# Patient Record
Sex: Female | Born: 1965
Health system: Southern US, Community
[De-identification: ages and names within clinical notes are randomized; demographics above are authoritative.]

## PROBLEM LIST (undated history)

## (undated) DIAGNOSIS — D219 Benign neoplasm of connective and other soft tissue, unspecified: Secondary | ICD-10-CM

## (undated) DIAGNOSIS — D649 Anemia, unspecified: Secondary | ICD-10-CM

## (undated) DIAGNOSIS — R87619 Unspecified abnormal cytological findings in specimens from cervix uteri: Secondary | ICD-10-CM

## (undated) DIAGNOSIS — A64 Unspecified sexually transmitted disease: Secondary | ICD-10-CM

## (undated) DIAGNOSIS — IMO0002 Reserved for concepts with insufficient information to code with codable children: Secondary | ICD-10-CM

## (undated) DIAGNOSIS — L309 Dermatitis, unspecified: Secondary | ICD-10-CM

## (undated) DIAGNOSIS — Z87898 Personal history of other specified conditions: Secondary | ICD-10-CM

## (undated) DIAGNOSIS — M1990 Unspecified osteoarthritis, unspecified site: Secondary | ICD-10-CM

## (undated) DIAGNOSIS — M419 Scoliosis, unspecified: Secondary | ICD-10-CM

## (undated) HISTORY — DX: Personal history of other specified conditions: Z87.898

## (undated) HISTORY — DX: Benign neoplasm of connective and other soft tissue, unspecified: D21.9

## (undated) HISTORY — PX: WISDOM TOOTH EXTRACTION: SHX21

## (undated) HISTORY — DX: Anemia, unspecified: D64.9

## (undated) HISTORY — DX: Scoliosis, unspecified: M41.9

## (undated) HISTORY — DX: Unspecified sexually transmitted disease: A64

## (undated) HISTORY — DX: Unspecified abnormal cytological findings in specimens from cervix uteri: R87.619

## (undated) HISTORY — DX: Reserved for concepts with insufficient information to code with codable children: IMO0002

---

## 1998-09-22 HISTORY — PX: KNEE SURGERY: SHX244

## 1999-03-08 ENCOUNTER — Other Ambulatory Visit: Admission: RE | Admit: 1999-03-08 | Discharge: 1999-03-08 | Payer: Self-pay | Admitting: Obstetrics and Gynecology

## 1999-06-25 ENCOUNTER — Ambulatory Visit (HOSPITAL_COMMUNITY): Admission: RE | Admit: 1999-06-25 | Discharge: 1999-06-25 | Payer: Self-pay | Admitting: Orthopedic Surgery

## 1999-06-25 ENCOUNTER — Encounter: Payer: Self-pay | Admitting: Orthopedic Surgery

## 1999-07-10 ENCOUNTER — Ambulatory Visit (HOSPITAL_BASED_OUTPATIENT_CLINIC_OR_DEPARTMENT_OTHER): Admission: RE | Admit: 1999-07-10 | Discharge: 1999-07-10 | Payer: Self-pay | Admitting: Orthopedic Surgery

## 1999-10-25 ENCOUNTER — Encounter: Payer: Self-pay | Admitting: Endocrinology

## 1999-10-25 ENCOUNTER — Ambulatory Visit (HOSPITAL_COMMUNITY): Admission: RE | Admit: 1999-10-25 | Discharge: 1999-10-25 | Payer: Self-pay | Admitting: Endocrinology

## 2000-01-28 ENCOUNTER — Other Ambulatory Visit: Admission: RE | Admit: 2000-01-28 | Discharge: 2000-01-28 | Payer: Self-pay | Admitting: Obstetrics and Gynecology

## 2000-05-07 ENCOUNTER — Other Ambulatory Visit: Admission: RE | Admit: 2000-05-07 | Discharge: 2000-05-07 | Payer: Self-pay | Admitting: Obstetrics & Gynecology

## 2000-06-04 ENCOUNTER — Encounter: Payer: Self-pay | Admitting: Obstetrics and Gynecology

## 2000-06-04 ENCOUNTER — Ambulatory Visit (HOSPITAL_COMMUNITY): Admission: RE | Admit: 2000-06-04 | Discharge: 2000-06-04 | Payer: Self-pay | Admitting: Obstetrics and Gynecology

## 2000-06-17 ENCOUNTER — Inpatient Hospital Stay (HOSPITAL_COMMUNITY): Admission: AD | Admit: 2000-06-17 | Discharge: 2000-06-17 | Payer: Self-pay | Admitting: Obstetrics & Gynecology

## 2000-08-11 ENCOUNTER — Inpatient Hospital Stay (HOSPITAL_COMMUNITY): Admission: AD | Admit: 2000-08-11 | Discharge: 2000-08-13 | Payer: Self-pay | Admitting: Obstetrics & Gynecology

## 2001-05-07 ENCOUNTER — Other Ambulatory Visit: Admission: RE | Admit: 2001-05-07 | Discharge: 2001-05-07 | Payer: Self-pay | Admitting: Obstetrics and Gynecology

## 2002-05-18 ENCOUNTER — Other Ambulatory Visit: Admission: RE | Admit: 2002-05-18 | Discharge: 2002-05-18 | Payer: Self-pay | Admitting: Obstetrics and Gynecology

## 2003-04-27 ENCOUNTER — Other Ambulatory Visit: Admission: RE | Admit: 2003-04-27 | Discharge: 2003-04-27 | Payer: Self-pay | Admitting: Obstetrics and Gynecology

## 2003-09-23 DIAGNOSIS — Z87898 Personal history of other specified conditions: Secondary | ICD-10-CM

## 2003-09-23 HISTORY — DX: Personal history of other specified conditions: Z87.898

## 2004-05-06 ENCOUNTER — Other Ambulatory Visit: Admission: RE | Admit: 2004-05-06 | Discharge: 2004-05-06 | Payer: Self-pay | Admitting: Obstetrics and Gynecology

## 2004-06-26 ENCOUNTER — Ambulatory Visit (HOSPITAL_COMMUNITY): Admission: RE | Admit: 2004-06-26 | Discharge: 2004-06-26 | Payer: Self-pay | Admitting: Obstetrics and Gynecology

## 2004-07-31 ENCOUNTER — Ambulatory Visit (HOSPITAL_COMMUNITY): Admission: RE | Admit: 2004-07-31 | Discharge: 2004-07-31 | Payer: Self-pay | Admitting: Obstetrics and Gynecology

## 2004-11-07 ENCOUNTER — Encounter: Admission: RE | Admit: 2004-11-07 | Discharge: 2004-11-07 | Payer: Self-pay | Admitting: Obstetrics and Gynecology

## 2004-11-07 ENCOUNTER — Encounter: Payer: Self-pay | Admitting: Obstetrics and Gynecology

## 2004-11-13 ENCOUNTER — Inpatient Hospital Stay (HOSPITAL_COMMUNITY): Admission: RE | Admit: 2004-11-13 | Discharge: 2004-11-16 | Payer: Self-pay | Admitting: Obstetrics and Gynecology

## 2005-05-20 ENCOUNTER — Other Ambulatory Visit: Admission: RE | Admit: 2005-05-20 | Discharge: 2005-05-20 | Payer: Self-pay | Admitting: Obstetrics and Gynecology

## 2005-06-26 ENCOUNTER — Ambulatory Visit (HOSPITAL_COMMUNITY): Admission: RE | Admit: 2005-06-26 | Discharge: 2005-06-26 | Payer: Self-pay | Admitting: Obstetrics and Gynecology

## 2006-07-16 ENCOUNTER — Other Ambulatory Visit: Admission: RE | Admit: 2006-07-16 | Discharge: 2006-07-16 | Payer: Self-pay | Admitting: Obstetrics and Gynecology

## 2007-01-18 ENCOUNTER — Encounter: Admission: RE | Admit: 2007-01-18 | Discharge: 2007-01-18 | Payer: Self-pay | Admitting: Obstetrics and Gynecology

## 2007-04-19 ENCOUNTER — Encounter: Admission: RE | Admit: 2007-04-19 | Discharge: 2007-04-19 | Payer: Self-pay | Admitting: Obstetrics and Gynecology

## 2007-04-23 ENCOUNTER — Encounter: Admission: RE | Admit: 2007-04-23 | Discharge: 2007-04-23 | Payer: Self-pay | Admitting: *Deleted

## 2007-05-03 ENCOUNTER — Encounter: Admission: RE | Admit: 2007-05-03 | Discharge: 2007-05-03 | Payer: Self-pay | Admitting: Obstetrics and Gynecology

## 2007-05-25 ENCOUNTER — Encounter: Admission: RE | Admit: 2007-05-25 | Discharge: 2007-05-25 | Payer: Self-pay | Admitting: Obstetrics and Gynecology

## 2007-06-08 ENCOUNTER — Ambulatory Visit (HOSPITAL_BASED_OUTPATIENT_CLINIC_OR_DEPARTMENT_OTHER): Admission: RE | Admit: 2007-06-08 | Discharge: 2007-06-08 | Payer: Self-pay | Admitting: Surgery

## 2007-08-27 ENCOUNTER — Encounter: Admission: RE | Admit: 2007-08-27 | Discharge: 2007-08-27 | Payer: Self-pay | Admitting: Surgery

## 2008-01-19 ENCOUNTER — Encounter: Admission: RE | Admit: 2008-01-19 | Discharge: 2008-01-19 | Payer: Self-pay | Admitting: Surgery

## 2008-01-21 ENCOUNTER — Encounter: Admission: RE | Admit: 2008-01-21 | Discharge: 2008-01-21 | Payer: Self-pay | Admitting: Surgery

## 2009-01-22 ENCOUNTER — Emergency Department (HOSPITAL_COMMUNITY): Admission: EM | Admit: 2009-01-22 | Discharge: 2009-01-22 | Payer: Self-pay | Admitting: Family Medicine

## 2009-05-15 ENCOUNTER — Encounter: Admission: RE | Admit: 2009-05-15 | Discharge: 2009-05-15 | Payer: Self-pay | Admitting: Surgery

## 2010-07-08 ENCOUNTER — Encounter: Admission: RE | Admit: 2010-07-08 | Discharge: 2010-07-08 | Payer: Self-pay | Admitting: Obstetrics and Gynecology

## 2011-02-04 NOTE — Op Note (Signed)
NAMEDEVITA, NIES               ACCOUNT NO.:  [id number]   MEDICAL RECORD NO.:  [id number]          PATIENT TYPE:  AMB   LOCATION:  [facility]                         FACILITY:  [facility]   PHYSICIAN:  Thomas A. Cornett, M.D.DATE OF BIRTH:  1966-07-19   DATE OF PROCEDURE:  06/08/2007  DATE OF DISCHARGE:                               OPERATIVE REPORT   PREOPERATIVE DIAGNOSIS:  Right breast mastitis.   POSTOPERATIVE DIAGNOSES:  1. Right breast mastitis.  2. Multiple complex right breast abscesses.   PROCEDURE:  Debridement of right breast with incision and drainage of  complex right breast multiple abscesses.   SURGEON:  Harriette Bouillon, M.D.   ANESTHESIA:  LMA with 20 mL of 0.25% Sensorcaine.   ESTIMATED BLOOD LOSS:  60 mL.   Cultures were taken.   DRAIN:  1/4 inch Penrose drain tied to itself through two separate stab  incisions.   INDICATIONS FOR PROCEDURE:  The patient is a 45 year old female whose  has seen my office for right breast swelling and abscesses.  We drained  two of these and it is still a recurrent problem.  She is brought to the  operative room today for exam under anesthesia, debridement of any  devitalized tissue and drainage of any undrained collections.   DESCRIPTION OF PROCEDURE:  The patient brought to the operating room,  placed supine.  Induction of LMA anesthesia, right breast prepped,  draped in sterile fashion.  She had to previous incisions, one at 11  o'clock at the nipple skin junction and a second at about 9 o'clock in  the right mid breast.  I was able after sterile prep and drape to find a  pus collection with palpation.  I placed a hemostat into it went down  deep into the breast at about 11 to 12 clock where the incision already  was.  It actually partially drained.  I took my second finger, went into  the second incision laterally and connected the two.  There is a chronic  abscess cavity, also another small abscess under the nipple and I was  able to my finger break up these loculations.  I then irrigated out the  wound with saline until clear.  Hemostasis was achieved.  I used a 1/4  inch Penrose to connect both wounds and tied the Penrose to itself for  more drainage than we had before.  There is a chronic and inflammatory  cavity as well and I had to debride some material  out of that.  I then packed both incisions with saline-soaked Kerlix and  placed dry dressings over this.  All final counts of sponge, needle and  instruments found be correct this portion of the case.  The patient was  then taken to recovery after awakening in satisfactory condition.      Thomas A. Cornett, M.D.  Electronically Signed     TAC/MEDQ  D:  06/08/2007  T:  06/09/2007  Job:  [id number]

## 2011-02-07 NOTE — H&P (Signed)
[facility]  Patient:    Wendy Huffman, Wendy Huffman                       MRN: [id number] Adm. Date:  [id number] Attending:  Cleatrice Burke Dictator:   Miguel Dibble, C.N.M.                         History and Physical  DATE OF BIRTH:                12-29-1965                                This is a 45 year old gravida 1, para 0 at 100 6/7 weeks who has been having prolonged prodromal labor over the last 24 hours.  After therapeutic rest with morphine for four hours she had progressed from 3 cm to 4 cm, completely effaced, vertex at -3, intact bag of waters, contracting stronger every three to four minutes.  She has a history of positive group beta strep.  PRENATAL LABORATORIES:        Hemoglobin 10.2, hematocrit 32.9, platelets 267. Blood type and Rh O+.  Rh antibodies negative.  Sickle cell trait negative. VDRL nonreactive.  Rubella titer immune.  Hepatitis B surface antigen negative.  HIV negative.  Pap smear within normal limits.  Gonorrhea and chlamydia cultures negative.  Maternal serum alpha-fetoprotein within normal limits.  Glucose challenge test within normal limits.  Group beta strep positive.                                Ultrasound reveals normal anatomy, confirms EDC.  ALLERGIES:                    No known drug allergies.  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  She discontinued Parlodel prior to conception.  FAMILY HISTORY:                Maternal grandmother with heart surgery, mother with lung disease and asthma, maternal grandmother with insulin-dependent diabetes.  GENETIC HISTORY:              Negative.  SOCIAL HISTORY:               African-American.  Single.  Father of the baby is Sharlyne Cai.  Currently, her friend is present  as support.  College graduate.  Works in Medical sales representative.  Father of the baby is a high Garment/textile technologist, works as an Training and development officer.  Denies smoking, alcohol, or drug abuse.  PHYSICAL EXAMINATION:  HEENT:                        Within normal limits.  LUNGS:                        Bilaterally clear.  HEART:                        Regular rate and rhythm.  ABDOMEN:  Soft, nontender.  Contractions every three to four minutes.  Fetal heart rate:  Mild variables.  PELVIC:                       Cervix is 4 cm, completely effaced, vertex at -3.  Intact bag of waters.  EXTREMITIES:                  Trace edema.  DTRs +1.  ASSESSMENT:                   Primigravida at term in early active phase labor, OP position.  Positive group beta strep.  PLAN:                         Admit to labor and delivery.  Notify  Dr. Elliot Gault of admission and status.  Routine [facility] orders including group beta strep prophylaxis with penicillin.  Anticipate amniotomy to augment labor, positioning to assist descent.  Anticipate normal spontaneous vaginal delivery. DD:  08/11/00 TD:  08/11/00 Job: [id number]

## 2011-02-07 NOTE — Op Note (Signed)
[facility]  Patient:    Wendy Huffman, Wendy Huffman                       MRN: [id number] Proc. Date: 08/11/00 Attending:  Dierdre Forth [place]                           Operative Report  PREOPERATIVE DIAGNOSES:         1. Term intrauterine pregnancy.                                 2. Active phase arrest.                                 3. Suspect occipitoposterior.  POSTOPERATIVE DIAGNOSES:        1. Term intrauterine pregnancy.                                 2. Active phase arrest.                                 3. Occipitoposterior, persistent.                                 4. Fundal posterior fibroid.  OPERATION:                      Low cervical transverse cesarean section, primary.  SURGEON:                        Georgina Peer, M.D.  ASSISTANT:                      Mack Guise, C.N.M.  ANESTHESIA:                     Epidural.  ESTIMATED BLOOD LOSS:           1500 cc.  COMPLICATIONS:                  None.  FINDINGS:                       Seven pound 12 ounce female named Donovan at 2034, occipitoposterior, Apgars 9 and 9, cord pH 7.11 arterial, 7 cm fundal fibroid noted on the posterior fundal aspect of the uterus.  Normal tubes and ovaries.  INDICATIONS:                    This is a 45 year old gravida 1, para 0, who presented at 39-3/7 weeks with ruptured membranes and regular contractions. She progressed spontaneously to 8 cm, however, there was no descent of the head past -2 station.  Significant and deep variable components without late component were noted.  Amnioinfusion failed to completely resolve these, however, they did not worsen.  With the patients cervix edematous and persistently _____ despite adequate contractions documented by IUPC, a decision for cesarean section was made.  Risks and benefits including bleeding, infection, damage to abdominal structures, blood clots were also informed  and  accepted.  DESCRIPTION OF PROCEDURE:       The patient was taken to the operating room. She had an epidural placed during labor and also a Foley catheter.  She was prepped and draped in the normal sterile fashion.  After adequate anesthesia was documented, transverse skin incision was made, taken through the fascia and the peritoneal cavity in the normal Pfannenstiel manner.  A transverse bladder flap was created and a transverse uterine incision was made.  At 2034 a female infant was noted to be in direct occipitoposterior presentation.  The head was slightly rotated anteriorly and delivered with the aid of fundal pressure.  The infant cried spontaneously. Mouth and nose were suctioned.  Infant was handed to pediatric team in attendance after the cord was doubly clamped and cut.  Cord blood was taken and the arterial cord pH was obtained which was 7.11.  Apgars were assigned at 9 and 9.  Placenta and membranes were removed.  There was an extension of the left side of the uterine incision from the manual head extension.  This was repaired in two layers of 0 Vicryl.  The incision was then closed in two layers of Vicryl with excellent hemostasis.  Tubes and ovaries were inspected and found to be normal.  There was a 7 cm fundal fibroid off the fundal posterior left portion of the uterus.  The peritoneal cavity was irrigated and all debris was removed.  The peritoneal surfaces appeared normal.  The fascia was closed with 0 Vicryl suture after the rectus muscles were closed over the bladder.  The subcutaneous tissue was closed with a plain stitch and the skin was closed with skin staples.  The patient received 2 g of Cefotan after the cord clamped.  Sponge, needle and instrument counts were correct. DD:  08/11/00 TD:  08/12/00 Job: [id number]

## 2011-02-07 NOTE — H&P (Signed)
Wendy Huffman, Wendy Huffman NO.:  [id number]   MEDICAL RECORD NO.:  [id number]          PATIENT TYPE:  INP   LOCATION:  NA                            FACILITY:  [facility]   PHYSICIAN:  Hal Morales, M.D.DATE OF BIRTH:  May 30, 1966   DATE OF ADMISSION:  DATE OF DISCHARGE:                                HISTORY & PHYSICAL   DATE OF ADMISSION:  November 13, 2004   HISTORY OF PRESENT ILLNESS:  Wendy Huffman is a gravida 2 para 1-0-0-1 at 65-  and-a-half weeks gestation who presents for repeat C-section. Her pregnancy  has been essentially uncomplicated. She has been size greater than dates  throughout secondary to obesity. Ultrasound evaluation of the fetus  throughout the pregnancy has shown normal growth and normal fluid. The  patient has been normotensive with no proteinuria.   Her pregnancy has been followed by the C.N.M./M.D. service at [facility] and is  remarkable for:  1.  Advanced maternal age.  2.  A history of fibroids.  3.  History of infertility.  4.  Previous C-section.  5.  History of increased blood pressure.  6.  Obesity.   The patient initially evaluated at [facility] on May 06, 2004 at [redacted] weeks  gestation. The patient declined amniocentesis. Her ultrasound at [facility] was within normal limits.   PRENATAL LABORATORY WORK:  On May 28, 2004:  Hemoglobin and hematocrit  10.3 and 33.7; platelets 269,000. Blood type and Rh O positive, antibody  screen negative. Sickle cell trait negative. VDRL nonreactive. Rubella  immune. Hepatitis B surface antigen negative. HIV nonreactive. Pap smear  within normal limits. GC and chlamydia negative. CF testing declined. Quad  screen declined. At 18 weeks, 1-hour glucose challenge within normal limits.  At 28 weeks, 1-hour glucose challenge also within normal limits, and RPR  nonreactive.   OBSTETRICAL HISTORY:  In 2001 the patient had a primary low transverse  cesarean section with the birth of a 7-pound 13-ounce  female infant with Dr.  Elliot Gault. Failure to progress, PIH, and fetal distress complicated that  delivery, and the current pregnancy.   MEDICAL HISTORY:  Significant for fibroid tumors. She does have a history of  infertility. She used Clomid to conceive this pregnancy. 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 Juleen China is following this  pituitary tumor. The patient was diagnosed with scoliosis as a child. She  has a history of C-section and left knee surgery.   FAMILY HISTORY:  Maternal grandmother with a history of heart disease. The  patient's mother with sarcoidosis and she died at age 70. A maternal aunt  and the patient's maternal grandmother with history of diabetes. The patient  has great uncles with prostate and bone cancer. Maternal grandfather with a  history of CVA.   GENETIC HISTORY:  Significant for two cousins of the third and fourth  generation on her maternal side with a history of cleft lip and palate and a  second cousin on her maternal side with a history of muscular dystrophy.   The patient has no  known drug allergies and she does deny the use of  tobacco, alcohol, and illicit drugs. Her height is 5 feet 5 inches. Her  pregravid weight is 276 and her weight at her last prenatal visit was 304  pounds.   SOCIAL HISTORY:  Wendy Huffman is a 45 year old married African-American  female. She works as a Warden/ranger. Her husband is a Company secretary. His  name is Mikaella Escalona. He is involved and supportive. They are [place] in  their faith.   REVIEW OF SYSTEMS:  Is as described above. The patient is typical of one  with a uterine pregnancy at term. She presents for repeat low transverse  cesarean section at 38-and-a-half weeks.   PLAN:  Admit per Dr. Dierdre Forth. Preoperative and routine surgical  orders per Dr. Pennie Rushing.      SDM/MEDQ  D:  11/12/2004  T:  11/12/2004  Job:  [id number]

## 2011-02-07 NOTE — Discharge Summary (Signed)
NAMEEMILIA, Wendy Huffman               ACCOUNT NO.:  [id number]   MEDICAL RECORD NO.:  [id number]          PATIENT TYPE:  INP   LOCATION:  9147                          [facility]ACILITY:  [facility]   PHYSICIAN:  Naima A. Dillard, M.D. DATE OF BIRTH:  1966-05-27   DATE OF ADMISSION:  11/13/2004  DATE OF DISCHARGE:  11/16/2004                                 DISCHARGE SUMMARY   ADMISSION DIAGNOSES:  1.  Intrauterine pregnancy at term.  2.  Previous cesarean section with desire for repeat.   DISCHARGE DIAGNOSES:  1.  Intrauterine pregnancy at term.  2.  Prior cesarean section with desire for repeat.  3.  Probable mild chronic hypertension.   PROCEDURE:  Repeat low transverse cesarean section.   HOSPITAL COURSE:  Wendy Huffman is a 45 year old gravida 2, para 1-0-0-1, at 1-  1/2 weeks, who presented on November 13, 2004, for scheduled repeat cesarean  section.  Her pregnancy had been remarkable for:  1) Advanced maternal age.  2) History of fibroids.  3) History of infertility.  4) Previous cesarean  section with desire for repeat.  5) History of increased blood pressure with  her last labor and over the last two visits.  6) Obesity.  The patient was  taken to the operating room where a repeat low transverse cesarean section  was performed.  The patient was delivered of a viable female by the name of  Wendy Huffman, 6 pounds 10 ounces with Apgars of 9 and 9.  The uterus contained a 4 cm  posterior fundal myoma.  Estimated blood loss was approximately 750 mL.  This was done under spinal anesthesia by Hal Morales, M.D.  The  patient tolerated the procedure well.  The skin was closed with staples and  a Al Pimple  drain was in place.  The infant was taken to the fullterm  nursery in good condition.  By postoperative day #1, the patient was doing  well.  She was up ad lib, she was breastfeeding.  Her hemoglobin was 10.0  down from 10.3.  White blood cell count was 13.8, platelet count was 215.  Her  incision was clean, dry, and intact.  She was given some supplemental  iron.  Through the rest of her hospital course, she was doing well.  Her JP  drain was noted to have scant serosanguineous drainage.  By postoperative  day #3, she was up ad lib, tolerating a regular diet.  She denied headache,  visual symptoms, or epigastric pain.  She was undecided regarding birth  control.  Her blood pressure on the day of discharge was 158/98, 137/89, and  146/92.  Other vital signs were stable.  Her incision was clean, dry, and  intact with staples noted.  The JP drain had minimal to no drainage through  the night.  Her extremities were within normal limits.  Dr. Normand Sloop was  consulted and the decision was made to maintain the staples for a total of  seven days postoperative.  The JP drain was removed without difficulty and a  plan was also made to recheck the patient's  blood pressure with her staple  removal on November 20, 2004.  The patient was deemed to have received the full  benefit of her hospital stay and was discharged home on November 16, 2004.   DISCHARGE INSTRUCTIONS:  Per [facility] handout.  PIH precautions  were also reviewed with the patient.   DISCHARGE MEDICATIONS:  1.  Motrin 600 mg p.o. q.6h p.r.n. pain.  2.  Tylox one to two p.o. q.3-4h p.r.n. pain.  3.  Prenatal vitamins one p.o. daily.   FOLLOW UP:  Discharge follow-up will occur on November 20, 2004, for staple  removal and blood pressure recheck and then at six weeks postoperatively.      VLL/MEDQ  D:  11/16/2004  T:  11/17/2004  Job:  [id number]

## 2011-02-07 NOTE — Discharge Summary (Signed)
[facility]  Patient:    MINAL, STULLER                       MRN: [id number] Adm. Date:  [id number] Disc. Date: [id number] Attending:  Cleatrice Burke Dictator:   Vance Gather Duplantis, C.N.M.                           Discharge Summary  ADMISSION DIAGNOSES:          1. Intrauterine pregnancy at term.                               2. Active phase of labor.                               3. Occipitoposterior.                               4. Positive group B Streptococcus.  DISCHARGE DIAGNOSES:          1. Intrauterine pregnancy at term.                               2. Active phase of labor.                               3. Occipitoposterior.                               4. Positive group B Streptococcus.                               5. Arrest of active phase of labor.                               5. Status post cesarean section.                               6. Fibroids                               7. Breast feeding.                               8. Desires oral contraceptives for birth                                  control.  PROCEDURES:                   Primary low transverse cesarean section for delivery of a viable female infant named Domenic Schwab who weighed 7 pounds 12 ounces with Apgars of 9/9 attended by Dr. Trudie Reed for delivery.  HOSPITAL COURSE:              Ms. Yetta Barre is a 45 year old gravida 1,  para 0, at 39-3/7 weeks who was presented with contractions every 2 to 3 minutes for the last 2 to 4 hours and was found to be 2 to 3 cm on admission and was admitted in early labor.  She progressed to about 8 cm but failed to progress further despite adequate contractions.  It was recommended to proceed with cesarean section for delivery.  She agreed and underwent the same for delivery of a viable female infant who weighted 7 pounds 12 ounces, Apgars 9/9, and whose name is Psychologist, prison and probation services.  He was found to be in the OP presentation at delivery.   His delivery was effected by Dr. Trudie Reed and Mack Guise, C.N.M.  Postoperatively, she has done well.  She is ambulating, voiding, and eating without difficulty.  She is breast feeding also without difficulty.  Her vital signs are stable, and she is afebrile.  She desires discharge today.  DISCHARGE INSTRUCTIONS:       As per the [facility] handout.  DISCHARGE MEDICATIONS:        1. Motrin 600 mg p.o. q.6h. p.r.n. pain.                               2. Tylox 1 to 2 p.o. q.4-6h. p.r.n. pain.                               3. Amoxicillin 500 mg t.i.d. for 14 days.                               4. Micronor 1 p.o. q.d. starting in 2 weeks.  DISCHARGE LABORATORY DATA:    Hemoglobin 9.5, WBC count 19.0.  DISCHARGE FOLLOWUP:           [facility] in six weeks or p.r.n. DD:  09/26/00 TD:  09/27/00 Job: [id number]

## 2011-02-07 NOTE — Op Note (Signed)
Wendy Huffman, BESSENT NO.:  [id number]   MEDICAL RECORD NO.:  [id number]          PATIENT TYPE:  INP   LOCATION:  9147                          FACILITY[facility]   PHYSICIAN:  Hal Morales, M.D.DATE OF BIRTH:  1966/04/25   DATE OF PROCEDURE:  11/13/2004  DATE OF DISCHARGE:                                 OPERATIVE REPORT   PREOPERATIVE DIAGNOSES:  1.  Intrauterine pregnancy at term.  2.  Prior cesarean section, desire for repeat cesarean section.   POSTOPERATIVE DIAGNOSES:  1.  Intrauterine pregnancy at term.  2.  Prior cesarean section, desire for repeat cesarean section.   OPERATION:  Repeat low transverse cesarean section.   SURGEON:  Hal Morales, M.D.   FIRST ASSISTANT:  Mack Guise, certified nurse midwife.   ANESTHESIA:  Spinal.   ESTIMATED BLOOD LOSS:  750 mL.   COMPLICATIONS:  None.   FINDINGS:  The patient was delivered of a female infant whose name is [name],  weighing 6 pounds 10 ounces, with Apgars of 9 and 9 at one and five minutes,  respectively.  The tubes and ovaries were normal for the gravid state.  The  uterus contained a 4 cm posterior fundal myoma.   PROCEDURE:  The patient taken to the operating room after appropriate  identification and placed on the operating table.  After the attainment of  adequate spinal anesthesia, she was placed in the supine position with a  left lateral tilt.  The abdomen and perineum were prepped with multiple  layers of Betadine and a Foley catheter inserted into the bladder and  connected to straight drainage.  The abdomen was draped as a sterile field.  The suprapubic region at the site of previous cesarean section incision was  infiltrated with 20 mL of 0.25% Marcaine after assuring adequate anesthesia.  A transverse incision was made in this area and the abdomen opened in  layers.  The peritoneum was entered and the bladder blade placed.  The uterus was incised approximately 2 cm above  the uterovesical fold and  that incision taken laterally on either side bluntly.  The infant was  delivered from the occiput transverse position and after having the nares  and pharynx DeLee suctioned, was handed off to the awaiting pediatricians.  The appropriate cord blood was drawn and the placenta noted to have  separated from the uterus and was removed from the operative field.  Uterine  incision was closed with a running interlocking suture of 0 Vicryl.  An  imbricating suture of 0 Vicryl was placed.  Hemostasis was achieved with a  serosal stitch of 2-0 Vicryl and a figure-of-eight stitch in the left  incisional apex.  Copious irrigation was carried out and hemostasis noted to  be adequate.  The abdominal peritoneum was closed with running suture of 2-0  Vicryl.  The rectus muscles were reapproximated in midline with figure-of-  eight suture of 2-0 Vicryl.  The rectus fascia was closed with a running  suture of 0 Vicryl, then reinforced on either side of midline with figure-of-  eight sutures of 0  Vicryl.  The subcutaneous tissue was irrigated and made  hemostatic with Bovie cautery.  A Jackson-Pratt drain was placed in  subcutaneous tissue and exited through a left lower quadrant stab wound.  The incision was closed with skin staples.  A sterile dressing was applied.  The patient was taken from the operating room to the recovery room in  satisfactory condition having tolerated the procedure well, with sponge and  instrument counts correct.  The infant went to the full-term nursery.      VPH/MEDQ  D:  11/13/2004  T:  11/13/2004  Job:  [id number]

## 2011-07-03 LAB — BASIC METABOLIC PANEL
BUN: 7
Calcium: 9
Creatinine, Ser: 0.68
GFR calc non Af Amer: >60
Glucose, Bld: 86
Potassium: 3.3 — ABNORMAL LOW

## 2011-07-03 LAB — CBC
HCT: 29 — ABNORMAL LOW
Platelets: 447 — ABNORMAL HIGH
RDW: 16 — ABNORMAL HIGH
WBC: 14.2 — ABNORMAL HIGH

## 2011-07-03 LAB — ANAEROBIC CULTURE

## 2011-07-03 LAB — DIFFERENTIAL
Basophils Absolute: 0
Basophils Relative: 0
Eosinophils Absolute: 0.3
Lymphocytes Relative: 23
Monocytes Absolute: 1 — ABNORMAL HIGH
Neutrophils Relative %: 68

## 2011-07-03 LAB — WOUND CULTURE: Culture: NO GROWTH

## 2011-07-03 LAB — POCT PREGNANCY, URINE: Operator id: 268271

## 2011-07-07 ENCOUNTER — Other Ambulatory Visit: Payer: Self-pay | Admitting: Obstetrics and Gynecology

## 2011-07-07 DIAGNOSIS — Z1239 Encounter for other screening for malignant neoplasm of breast: Secondary | ICD-10-CM

## 2011-07-21 ENCOUNTER — Ambulatory Visit
Admission: RE | Admit: 2011-07-21 | Discharge: 2011-07-21 | Disposition: A | Discharge status: Home / Self Care | Payer: 59 | Source: Ambulatory Visit | Attending: Obstetrics and Gynecology | Admitting: Obstetrics and Gynecology

## 2011-07-21 DIAGNOSIS — Z1239 Encounter for other screening for malignant neoplasm of breast: Secondary | ICD-10-CM

## 2012-01-07 ENCOUNTER — Telehealth: Payer: Self-pay | Admitting: Obstetrics and Gynecology

## 2012-01-19 ENCOUNTER — Other Ambulatory Visit: Payer: Self-pay | Admitting: Obstetrics and Gynecology

## 2012-01-20 NOTE — Progress Notes (Signed)
Addended by: Bani Gianfrancesco P on: 01/20/2012 10:48 AM   Modules accepted: Orders  

## 2012-01-22 ENCOUNTER — Encounter: Payer: Self-pay | Admitting: Obstetrics and Gynecology

## 2012-02-02 ENCOUNTER — Encounter (HOSPITAL_COMMUNITY): Payer: Self-pay | Admitting: Pharmacist

## 2012-02-09 ENCOUNTER — Encounter: Payer: Self-pay | Admitting: Obstetrics and Gynecology

## 2012-02-09 ENCOUNTER — Telehealth: Payer: Self-pay | Admitting: Obstetrics and Gynecology

## 2012-02-09 NOTE — Telephone Encounter (Signed)
LTC SCHEDULED FOR 02/20/12 @ 11:15 WITH [facility].  [employer] EFFECTIVE  09/23/11.  PLAN PAYS 80/20 AFTER A $1,000 DEDUCTIBLE.  PRE-OP DUE $109.48.

## 2012-02-11 ENCOUNTER — Encounter: Payer: Self-pay | Admitting: Obstetrics and Gynecology

## 2012-02-11 ENCOUNTER — Encounter (HOSPITAL_COMMUNITY): Payer: Self-pay

## 2012-02-11 ENCOUNTER — Encounter (HOSPITAL_COMMUNITY)
Admission: RE | Admit: 2012-02-11 | Discharge: 2012-02-11 | Disposition: A | Discharge status: Home / Self Care | Payer: 59 | Source: Ambulatory Visit | Attending: Obstetrics and Gynecology | Admitting: Obstetrics and Gynecology

## 2012-02-11 HISTORY — DX: Dermatitis, unspecified: L30.9

## 2012-02-11 HISTORY — DX: Unspecified osteoarthritis, unspecified site: M19.90

## 2012-02-11 LAB — CBC
Platelets: 280 10*3/uL (ref 150–400)
RBC: 4.98 MIL/uL (ref 3.87–5.11)
WBC: 11.1 10*3/uL — ABNORMAL HIGH (ref 4.0–10.5)

## 2012-02-11 LAB — SURGICAL PCR SCREEN
MRSA, PCR: NEGATIVE
Staphylococcus aureus: POSITIVE — AB

## 2012-02-11 NOTE — Patient Instructions (Addendum)
   Your procedure is scheduled on: Friday May 31st  Enter through the Main Entrance of [facility] at: 9:45am Pick up the phone at the desk and dial [phone] and inform us of your arrival.  Please call this number if you have any problems the morning of surgery: [phone]  Remember: Do not eat food after midnight: Thursday Do not drink clear liquids after:midnightThursday Take these medicines the morning of surgery with a SIP OF WATER: none  Do not wear jewelry, make-up, or FINGER nail polish Do not wear lotions, powders, perfumes or deodorant. Do not shave 48 hours prior to surgery. Do not bring valuables to the hospital. Contacts, dentures or bridgework may not be worn into surgery.    Patients discharged on the day of surgery will not be allowed to drive home.     Remember to use your hibiclens as instructed.Please shower with 1/2 bottle the evening before your surgery and the other 1/2 bottle the morning of surgery. Neck down avoiding private area.

## 2012-02-12 ENCOUNTER — Encounter: Payer: Self-pay | Admitting: Obstetrics and Gynecology

## 2012-02-12 ENCOUNTER — Ambulatory Visit (INDEPENDENT_AMBULATORY_CARE_PROVIDER_SITE_OTHER): Payer: 59 | Admitting: Obstetrics and Gynecology

## 2012-02-12 VITALS — BP 120/80 | HR 76 | Temp 98.2°F | Resp 16 | Ht 66.0 in | Wt 286.0 lb

## 2012-02-12 DIAGNOSIS — L309 Dermatitis, unspecified: Secondary | ICD-10-CM | POA: Insufficient documentation

## 2012-02-12 DIAGNOSIS — Z3009 Encounter for other general counseling and advice on contraception: Secondary | ICD-10-CM | POA: Insufficient documentation

## 2012-02-12 NOTE — Progress Notes (Signed)
Patient ID: Wendy Huffman, female   DOB: 04/14/1966, 46 y.o.   MRN: [id number]    Chief Complaint   Patient presents with   .  Pre-op Exam       LTC        HPI Wendy Huffman is a 46 y.o. female.  Without complaint HPI Desires surgical sterilization    Past Medical History   Diagnosis  Date   .  Abnormal Pap smear     .  Anemia     .  Fibroid         history    .  H/O: pituitary tumor  2005   .  Scoliosis         DX as a child back pain   .  STD (female)         H/O of std's    .  Arthritis         knee   .  Eczema           Past Surgical History   Procedure  Date   .  Cesarean section     .  Knee surgery  2000       left  knee    .  Cesarean section  x2   .  Wisdom tooth extraction  80's         Family History   Problem  Relation  Age of Onset   .  Lung disease  Mother  41       past away   .  Diabetes  Maternal Aunt     .  Diabetes  Maternal Grandmother     .  Heart disease  Paternal Grandfather          Social History History   Substance Use Topics   .  Smoking status:  Never Smoker    .  Smokeless tobacco:  Never Used   .  Alcohol Use:  Yes         occasional        No Known Allergies    Current Outpatient Prescriptions   Medication  Sig  Dispense  Refill   .  celecoxib (CELEBREX) 200 MG capsule  Take 200 mg by mouth 2 (two) times daily as needed. For pain         .  fluticasone (CUTIVATE) 0.05 % cream  Apply 1 application topically 2 (two) times daily as needed. For eczema on face         .  Halobetasol & Lactic Acid (ULTRAVATE X, OINTMENT, EX)  Apply 1 application topically 2 (two) times daily as needed. For eczema              Review of Systems Review of Systems  Constitutional: Negative.   HENT: Negative.   Eyes: Negative.   Respiratory: Negative.   Cardiovascular: Negative.   Gastrointestinal: Negative.   Genitourinary: Negative.   Musculoskeletal: Negative.   Neurological: Negative.   Hematological: Negative.     Psychiatric/Behavioral: Negative.       Blood pressure 120/80, pulse 76, temperature 98.2 F (36.8 C), temperature source Oral, resp. rate 16, height 5' 6" (1.676 m), weight 286 lb (129.729 kg), last menstrual period 02/02/2012.   Physical Exam Physical Exam BP 120/80  Pulse 76  Temp(Src) 98.2 F (36.8 C) (Oral)  Resp 16  Ht 5' 6" (1.676 m)  Wt 286 lb (129.729 kg)  BMI 46.16 kg/m2  LMP 02/02/2012    General   Appearance:   Alert, cooperative, no distress, appears stated age, morbidly obese   Head:   Normocephalic, without obvious abnormality, atraumatic   Eyes:   PERRL, conjunctiva/corneas clear, EOM's intact, fundi benign, both eyes   Ears:   Normal TM's and external ear canals, both ears   Nose:  Nares normal, septum midline,mucosa normal, no drainage or sinus tenderness   Throat:  Lips, mucosa, and tongue normal; teeth and gums normal   Neck:  Supple, symmetrical, trachea midline, no adenopathy;  thyroid: not enlarged, symmetric, no tenderness/mass/nodules; no carotid bruit or JVD   Back:    Symmetric, no curvature, ROM normal, no CVA tenderness   Lungs:    Clear to auscultation bilaterally, respirations unlabored   Breasts:   No masses or tenderness   Heart:   Regular rate and rhythm, S1 and S2 normal, no murmur, rub, or gallop   Abdomen:    Soft, non-tender, bowel sounds active all four quadrants,  no masses, no organomegaly, exam compromised by patient habitus   Pelvic:  External genitalia, nl  Vagina rougous Cx nl Uterus does not feel enlarged but exam is compromised by patient habitus   Extremities:  Extremities normal, atraumatic, no cyanosis or edema   Pulses:  2+ and symmetric   Skin:  Skin color, texture, turgor normal, no rashes or lesions   Lymph nodes:  Cervical, supraclavicular, and axillary nodes normal   Neurologic:  Normal        Assessment Desires surgical sterilization      Plan Patient desires permanent sterilization.  Other reversible forms  of contraception were discussed with patient; she declines all other modalities. Risks of procedure discussed with patient including but not limited to: risk of regret, permanence of method, bleeding, infection, injury to surrounding organs and need for additional procedures.  Failure risk of 0.5-1% with increased risk of ectopic gestation if pregnancy occurs was also discussed with patient.     Patient verbalized understanding of these risks and wants to proceed with sterilization.      LTC scheduled at [facility] on 02/20/12          Billal Rollo P 02/12/2012, 8:06 PM             

## 2012-02-20 ENCOUNTER — Encounter (HOSPITAL_COMMUNITY): Payer: Self-pay | Admitting: Anesthesiology

## 2012-02-20 ENCOUNTER — Encounter (HOSPITAL_COMMUNITY)
Admission: RE | Disposition: A | Discharge status: Home / Self Care | Payer: Self-pay | Source: Ambulatory Visit | Attending: Obstetrics and Gynecology

## 2012-02-20 ENCOUNTER — Ambulatory Visit (HOSPITAL_COMMUNITY): Payer: 59 | Admitting: Anesthesiology

## 2012-02-20 ENCOUNTER — Ambulatory Visit (HOSPITAL_COMMUNITY)
Admission: RE | Admit: 2012-02-20 | Discharge: 2012-02-20 | Disposition: A | Discharge status: Home / Self Care | Payer: 59 | Source: Ambulatory Visit | Attending: Obstetrics and Gynecology | Admitting: Obstetrics and Gynecology

## 2012-02-20 DIAGNOSIS — Z302 Encounter for sterilization: Secondary | ICD-10-CM

## 2012-02-20 DIAGNOSIS — Z01812 Encounter for preprocedural laboratory examination: Secondary | ICD-10-CM | POA: Insufficient documentation

## 2012-02-20 DIAGNOSIS — Z01818 Encounter for other preprocedural examination: Secondary | ICD-10-CM | POA: Insufficient documentation

## 2012-02-20 HISTORY — PX: LAPAROSCOPIC TUBAL LIGATION: SHX1937

## 2012-02-20 LAB — PREGNANCY, URINE: Preg Test, Ur: NEGATIVE

## 2012-02-20 SURGERY — LIGATION, FALLOPIAN TUBE, LAPAROSCOPIC
Anesthesia: General | Site: Abdomen | Laterality: Bilateral | Wound class: Clean Contaminated

## 2012-02-20 MED ORDER — BUPIVACAINE HCL (PF) 0.25 % IJ SOLN
INTRAMUSCULAR | Status: DC | PRN
  Administered 2012-02-20: 5 mL

## 2012-02-20 MED ORDER — MIDAZOLAM HCL 5 MG/5ML IJ SOLN
INTRAMUSCULAR | Status: DC | PRN
  Administered 2012-02-20: 2 mg via INTRAVENOUS

## 2012-02-20 MED ORDER — MEPERIDINE HCL 25 MG/ML IJ SOLN: 6.2500 mg | INTRAMUSCULAR | Status: DC | PRN

## 2012-02-20 MED ORDER — FENTANYL CITRATE 0.05 MG/ML IJ SOLN
INTRAMUSCULAR | Status: DC | PRN
  Administered 2012-02-20 (×3): 50 ug via INTRAVENOUS
  Administered 2012-02-20: 100 ug via INTRAVENOUS

## 2012-02-20 MED ORDER — DEXAMETHASONE SODIUM PHOSPHATE 10 MG/ML IJ SOLN
INTRAMUSCULAR | Status: AC
  Filled 2012-02-20: qty 1

## 2012-02-20 MED ORDER — GLYCOPYRROLATE 0.2 MG/ML IJ SOLN
INTRAMUSCULAR | Status: DC | PRN
  Administered 2012-02-20: 0.4 mg via INTRAVENOUS
  Administered 2012-02-20: 0.2 mg via INTRAVENOUS

## 2012-02-20 MED ORDER — LACTATED RINGERS IV SOLN
INTRAVENOUS | Status: DC
  Administered 2012-02-20 (×2): via INTRAVENOUS

## 2012-02-20 MED ORDER — NEOSTIGMINE METHYLSULFATE 1 MG/ML IJ SOLN
INTRAMUSCULAR | Status: AC
  Filled 2012-02-20: qty 10

## 2012-02-20 MED ORDER — LIDOCAINE HCL (CARDIAC) 20 MG/ML IV SOLN
INTRAVENOUS | Status: AC
  Filled 2012-02-20: qty 5

## 2012-02-20 MED ORDER — METOCLOPRAMIDE HCL 5 MG/ML IJ SOLN: 10.0000 mg | Freq: Once | INTRAMUSCULAR | Status: DC | PRN

## 2012-02-20 MED ORDER — FENTANYL CITRATE 0.05 MG/ML IJ SOLN
25.0000 ug | INTRAMUSCULAR | Status: DC | PRN
  Administered 2012-02-20: 50 ug via INTRAVENOUS

## 2012-02-20 MED ORDER — LIDOCAINE HCL (CARDIAC) 20 MG/ML IV SOLN
INTRAVENOUS | Status: DC | PRN
  Administered 2012-02-20: 5 mg via INTRAVENOUS

## 2012-02-20 MED ORDER — DEXAMETHASONE SODIUM PHOSPHATE 10 MG/ML IJ SOLN
INTRAMUSCULAR | Status: DC | PRN
  Administered 2012-02-20: 10 mg via INTRAVENOUS

## 2012-02-20 MED ORDER — GLYCOPYRROLATE 0.2 MG/ML IJ SOLN
INTRAMUSCULAR | Status: AC
  Filled 2012-02-20: qty 1

## 2012-02-20 MED ORDER — SILVER NITRATE-POT NITRATE 75-25 % EX MISC
CUTANEOUS | Status: AC
  Filled 2012-02-20: qty 1

## 2012-02-20 MED ORDER — HYDROCODONE-ACETAMINOPHEN 5-325 MG PO TABS
1.0000 | ORAL_TABLET | Freq: Once | ORAL | Status: AC
  Administered 2012-02-20: 1 via ORAL

## 2012-02-20 MED ORDER — HYDROCODONE-ACETAMINOPHEN 5-325 MG PO TABS
ORAL_TABLET | ORAL | Status: AC
  Administered 2012-02-20: 1 via ORAL
  Filled 2012-02-20: qty 1

## 2012-02-20 MED ORDER — FENTANYL CITRATE 0.05 MG/ML IJ SOLN
INTRAMUSCULAR | Status: AC
  Filled 2012-02-20: qty 2

## 2012-02-20 MED ORDER — NEOSTIGMINE METHYLSULFATE 1 MG/ML IJ SOLN
INTRAMUSCULAR | Status: DC | PRN
  Administered 2012-02-20: 2 mg via INTRAVENOUS
  Administered 2012-02-20: 1 mg via INTRAVENOUS

## 2012-02-20 MED ORDER — PROPOFOL 10 MG/ML IV EMUL
INTRAVENOUS | Status: AC
  Filled 2012-02-20: qty 20

## 2012-02-20 MED ORDER — HYDROCODONE-ACETAMINOPHEN 5-500 MG PO TABS: 1.0000 | ORAL_TABLET | ORAL | Status: AC | PRN

## 2012-02-20 MED ORDER — ONDANSETRON HCL 4 MG/2ML IJ SOLN
INTRAMUSCULAR | Status: DC | PRN
  Administered 2012-02-20: 4 mg via INTRAVENOUS

## 2012-02-20 MED ORDER — KETOROLAC TROMETHAMINE 30 MG/ML IJ SOLN
INTRAMUSCULAR | Status: DC | PRN
  Administered 2012-02-20: 30 mg via INTRAVENOUS

## 2012-02-20 MED ORDER — BUPIVACAINE HCL (PF) 0.25 % IJ SOLN
INTRAMUSCULAR | Status: AC
  Filled 2012-02-20: qty 30

## 2012-02-20 MED ORDER — ROCURONIUM BROMIDE 100 MG/10ML IV SOLN
INTRAVENOUS | Status: DC | PRN
  Administered 2012-02-20: 30 mg via INTRAVENOUS
  Administered 2012-02-20: 10 mg via INTRAVENOUS

## 2012-02-20 MED ORDER — MIDAZOLAM HCL 2 MG/2ML IJ SOLN
INTRAMUSCULAR | Status: AC
  Filled 2012-02-20: qty 2

## 2012-02-20 MED ORDER — PROPOFOL 10 MG/ML IV EMUL
INTRAVENOUS | Status: DC | PRN
  Administered 2012-02-20: 190 mg via INTRAVENOUS
  Administered 2012-02-20: 100 mg via INTRAVENOUS

## 2012-02-20 MED ORDER — ONDANSETRON HCL 4 MG/2ML IJ SOLN
INTRAMUSCULAR | Status: AC
  Filled 2012-02-20: qty 2

## 2012-02-20 MED ORDER — FENTANYL CITRATE 0.05 MG/ML IJ SOLN
INTRAMUSCULAR | Status: AC
  Filled 2012-02-20: qty 5

## 2012-02-20 SURGICAL SUPPLY — 19 items
CATH ROBINSON RED A/P 16FR (CATHETERS) ×2 IMPLANT
CHLORAPREP W/TINT 26ML (MISCELLANEOUS) ×2 IMPLANT
CLOTH BEACON ORANGE TIMEOUT ST (SAFETY) ×2 IMPLANT
DERMABOND ADVANCED (GAUZE/BANDAGES/DRESSINGS) ×2
DERMABOND ADVANCED .7 DNX12 (GAUZE/BANDAGES/DRESSINGS) ×2 IMPLANT
GLOVE SURG SS PI 6.5 STRL IVOR (GLOVE) ×4 IMPLANT
GOWN PREVENTION PLUS LG XLONG (DISPOSABLE) ×4 IMPLANT
PACK LAPAROSCOPY BASIN (CUSTOM PROCEDURE TRAY) ×2 IMPLANT
PENCIL BUTTON HOLSTER BLD 10FT (ELECTRODE) ×2 IMPLANT
STRIP CLOSURE SKIN 1/4X3 (GAUZE/BANDAGES/DRESSINGS) ×2 IMPLANT
SUT VIC AB 3-0 PS2 18 (SUTURE) ×1
SUT VIC AB 3-0 PS2 18XBRD (SUTURE) ×1 IMPLANT
SUT VICRYL 0 UR6 27IN ABS (SUTURE) ×4 IMPLANT
TOWEL OR 17X24 6PK STRL BLUE (TOWEL DISPOSABLE) ×4 IMPLANT
TROCAR BALL TOP DISP 5MM (ENDOMECHANICALS) IMPLANT
TROCAR Z-THREAD BLADED 11X100M (TROCAR) IMPLANT
TROCAR Z-THREAD BLADED 5X100MM (TROCAR) IMPLANT
WARMER LAPAROSCOPE (MISCELLANEOUS) ×2 IMPLANT
WATER STERILE IRR 1000ML POUR (IV SOLUTION) ×2 IMPLANT

## 2012-02-20 NOTE — Op Note (Signed)
Laparoscopy For  tubal sterilizationProcedure Note  Indications: The patient is a 46 y.o. female with desire for surgical sterilization  Pre-operative Diagnosis: desires surgical sterilization     Morbid obesity  Post-operative Diagnosis: same  Surgeon: Dierdre Forth P   Assistants: none  Anesthesia: General endotracheal anesthesia  ASA Class: 3  Procedure Details  The patient was seen in the Holding Room. The risks, benefits, complications, treatment options, and expected outcomes were discussed with the patient. The possibilities of reaction to medication, pulmonary aspiration, perforation of viscus, bleeding, recurrent infection, the need for additional procedures, failure to diagnose a condition, and creating a complication requiring transfusion or operation were discussed with the patient, as well as the small possibility that subsequent pregnancy it may occur. The patient concurred with the proposed plan, giving informed consent. The patient was taken to the Operating Room, identified as Wendy Huffman and the procedure verified as laparoscopy for tubal sterilization. A Time Out was held and the above information confirmed.  After induction of general anesthesia, the patient was placed in modified dorsal lithotomy position where she was prepped abdominally with chlor prep and vaginal bleeding with Betadine, draped, and catheterized in the normal, sterile fashion.  The cervix was visualized and an intrauterine manipulator was placed. The subumbilical area was infiltrated with 5 cc of quarter percent MarcaineA 2 cm umbilical incision was then performed.the fascia was then accessed by a combination of blunt and sharp dissection and elevated. The fascia was incised and the peritoneum entered bluntly. The fascia was marked with a suture of 0 Vicryl on either side and the Hossein cannula was placed into the Peritoneal Cavity under Direct Visualization.  The pneumoperitoneum was established.  The above findings were noted.the right fallopian tube was identified, followed to its fimbriated end, and grasped at the isthmic portion then elevated. It was cauterized in 2 adjacent areas, with feedback from the bipolar energy source that complete desiccation had occurred. A similar procedure was carried out on the opposite side. Hemostasis was noted to be adequate.  Following the procedure the umbilical sheath was removed after intra-abdominal carbon dioxide was expressed. The fascia was closed with the sutures that had been held upon placement of the [facility] cannula.  The remainder of the incision was closed with subcutaneous and subcuticular sutures of 3-0 Monocryl. The intrauterine manipulator was then removed.  Instrument, sponge, and needle counts were correct prior to abdominal closure and at the conclusion of the case.   Findings:the uterus was globally enlarged with out specific myomata noted .  The tubes were normal bilaterally. The ovaries were not well visualized secondary to enlargement of the uterus.  *Estimated Blood Loss:  Minimal         Drains: none         Total IV Fluids:1049mL         Specimens: none              Complications:  None; patient tolerated the procedure well.         Disposition: PACU - hemodynamically stable.         Condition: stable

## 2012-02-20 NOTE — Transfer of Care (Signed)
Immediate Anesthesia Transfer of Care Note  Patient: Wendy Huffman  Procedure(s) Performed: Procedure(s) (LRB): LAPAROSCOPIC TUBAL LIGATION (Bilateral)  Patient Location: PACU  Anesthesia Type: General  Level of Consciousness: sedated  Airway & Oxygen Therapy: Patient Spontanous Breathing and Patient connected to nasal cannula oxygen  Post-op Assessment: Report given to PACU RN  Post vital signs: Reviewed and stable  Complications: No apparent anesthesia complications

## 2012-02-20 NOTE — H&P (Signed)
Patient ID: Wendy Huffman, female   DOB: [birth year], 46 y.o.   MRN: [id number]    Chief Complaint   Patient presents with   .  Pre-op Exam       LTC        HPI Wendy Huffman is a 46 y.o. female.  Without complaint HPI Desires surgical sterilization    Past Medical History   Diagnosis  Date   .  Abnormal Pap smear     .  Anemia     .  Fibroid         history    .  H/O: pituitary tumor  2005   .  Scoliosis         DX as a child back pain   .  STD (female)         H/O of std's    .  Arthritis         knee   .  Eczema           Past Surgical History   Procedure  Date   .  Cesarean section     .  Knee surgery  2000       left  knee    .  Cesarean section  x2   .  Wisdom tooth extraction  80's         Family History   Problem  Relation  Age of Onset   .  Lung disease  Mother  57       past away   .  Diabetes  Maternal Aunt     .  Diabetes  Maternal Grandmother     .  Heart disease  Paternal Grandfather          Social History History   Substance Use Topics   .  Smoking status:  Never Smoker    .  Smokeless tobacco:  Never Used   .  Alcohol Use:  Yes         occasional        No Known Allergies    Current Outpatient Prescriptions   Medication  Sig  Dispense  Refill   .  celecoxib (CELEBREX) 200 MG capsule  Take 200 mg by mouth 2 (two) times daily as needed. For pain         .  fluticasone (CUTIVATE) 0.05 % cream  Apply 1 application topically 2 (two) times daily as needed. For eczema on face         .  Halobetasol & Lactic Acid (ULTRAVATE X, OINTMENT, EX)  Apply 1 application topically 2 (two) times daily as needed. For eczema              Review of Systems Review of Systems  Constitutional: Negative.   HENT: Negative.   Eyes: Negative.   Respiratory: Negative.   Cardiovascular: Negative.   Gastrointestinal: Negative.   Genitourinary: Negative.   Musculoskeletal: Negative.   Neurological: Negative.   Hematological: Negative.     Psychiatric/Behavioral: Negative.       Blood pressure 120/80, pulse 76, temperature 98.2 F (36.8 C), temperature source Oral, resp. rate 16, height 5\' 6"  (1.676 m), weight 286 lb (129.729 kg), last menstrual period 02/02/2012.   Physical Exam Physical Exam BP 120/80  Pulse 76  Temp(Src) 98.2 F (36.8 C) (Oral)  Resp 16  Ht 5\' 6"  (1.676 m)  Wt 286 lb (129.729 kg)  BMI 46.16 kg/m2  LMP 02/02/2012    General  Appearance:   Alert, cooperative, no distress, appears stated age, morbidly obese   Head:   Normocephalic, without obvious abnormality, atraumatic   Eyes:   PERRL, conjunctiva/corneas clear, EOM's intact, fundi benign, both eyes   Ears:   Normal TM's and external ear canals, both ears   Nose:  Nares normal, septum midline,mucosa normal, no drainage or sinus tenderness   Throat:  Lips, mucosa, and tongue normal; teeth and gums normal   Neck:  Supple, symmetrical, trachea midline, no adenopathy;  thyroid: not enlarged, symmetric, no tenderness/mass/nodules; no carotid bruit or JVD   Back:    Symmetric, no curvature, ROM normal, no CVA tenderness   Lungs:    Clear to auscultation bilaterally, respirations unlabored   Breasts:   No masses or tenderness   Heart:   Regular rate and rhythm, S1 and S2 normal, no murmur, rub, or gallop   Abdomen:    Soft, non-tender, bowel sounds active all four quadrants,  no masses, no organomegaly, exam compromised by patient habitus   Pelvic:  External genitalia, nl  Vagina rougous Cx nl Uterus does not feel enlarged but exam is compromised by patient habitus   Extremities:  Extremities normal, atraumatic, no cyanosis or edema   Pulses:  2+ and symmetric   Skin:  Skin color, texture, turgor normal, no rashes or lesions   Lymph nodes:  Cervical, supraclavicular, and axillary nodes normal   Neurologic:  Normal        Assessment Desires surgical sterilization      Plan Patient desires permanent sterilization.  Other reversible forms  of contraception were discussed with patient; she declines all other modalities. Risks of procedure discussed with patient including but not limited to: risk of regret, permanence of method, bleeding, infection, injury to surrounding organs and need for additional procedures.  Failure risk of 0.5-1% with increased risk of ectopic gestation if pregnancy occurs was also discussed with patient.     Patient verbalized understanding of these risks and wants to proceed with sterilization.      LTC scheduled at [facility] on 02/20/12          Wendy Huffman P 02/12/2012, 8:06 PM

## 2012-02-20 NOTE — Discharge Instructions (Signed)
Tubal Ligation Care After Please read the instructions outlined below and refer to this sheet in the next few weeks. These discharge instructions give you general information on care after leaving the hospital. Your surgeon may also give you specific instructions. While treatment has been planned according to the most current medical practices available, unavoidable complications sometimes happen. If you have any problems or questions after discharge, please call your surgeon or caregiver. HOME CARE INSTRUCTIONS  Do not drink alcohol, drive a car, use public transportation, or sign important papers for at least 1 day following surgery.   Only take over-the-counter or prescription medicines for pain, discomfort, or fever as directed by your caregiver.   If your surgery was done as a same-day surgery, make sure you have a responsible adult with you for the first 24 hours following your surgery.   You may resume normal diet and activities as directed.   Change dressings as directed.   Do not douche or engage in sexual intercourse until your surgeon has given you permission.  SEEK MEDICAL CARE IF:   You have redness, swelling, or increasing pain in the wound or wound area.   You have pus coming from the wound.   You have a fever.   You have a bad smell coming from a wound or dressing.   Your wound edges separate after sutures or staples have been removed.   You have increasing abdominal pain.   You have excessive vaginal bleeding.   You have increasing pain in your shoulders or chest.   You have dizzy episodes or fainting while standing.   You have persistent nausea or vomiting.  SEEK IMMEDIATE MEDICAL CARE IF:   You have a rash.   You have a hard time breathing.   You feel you are developing any reaction or side effects to medications taken.  MAKE SURE YOU:   Understand these instructions.   Will watch this condition.   Will get help right away if you are not doing well  or get worse.  Document Released: 06/13/2004 Document Revised: 08/28/2011 Document Reviewed: 12/02/2007 [facility] Patient Information 2012 [place], [place].DISCHARGE INSTRUCTIONS: Laparoscopy  The following instructions have been prepared to help you care for yourself upon your return home today.  Wound care: Marland Kitchen Do not get the incision wet for the first 24 hours. The incision should be kept clean and dry. . The Band-Aids or dressings may be removed the day after surgery. . Should the incision become sore, red, and swollen after the first week, check with your doctor.  Personal hygiene: . Shower the day after your procedure.  Activity and limitations: . Do NOT drive or operate any equipment today. . Do NOT lift anything more than 15 pounds for 2-3 weeks after surgery. . Do NOT rest in bed all day. . Walking is encouraged. Walk each day, starting slowly with 5-minute walks 3 or 4 times a day. Slowly increase the length of your walks. . Walk up and down stairs slowly. . Do NOT do strenuous activities, such as golfing, playing tennis, bowling, running, biking, weight lifting, gardening, mowing, or vacuuming for 2-4 weeks. Ask your doctor when it is okay to start.  Diet: Eat a light meal as desired this evening. You may resume your usual diet tomorrow.  Return to work: This is dependent on the type of work you do. For the most part you can return to a desk job within a week of surgery. If you are more active at work,  please discuss this with your doctor.  What to expect after your surgery: You may have a slight burning sensation when you urinate on the first day. You may have a very small amount of blood in the urine. Expect to have a small amount of vaginal discharge/light bleeding for 1-2 weeks. It is not unusual to have abdominal soreness and bruising for up to 2 weeks. You may be tired and need more rest for about 1 week. You may experience shoulder pain for 24-72 hours. Lying flat in bed may  relieve it.  Call your doctor for any of the following: . Develop a fever of 100.4 or greater . Inability to urinate 6 hours after discharge from hospital . Severe pain not relieved by pain medications . Persistent of heavy bleeding at incision site . Redness or swelling around incision site after a week . Increasing nausea or vomiting  Patient Signature________________________________________ Nurse Signature_________________________________________

## 2012-02-20 NOTE — Anesthesia Preprocedure Evaluation (Signed)
Anesthesia Evaluation  Patient identified by MRN, date of birth, ID band Patient awake    Reviewed: Allergy & Precautions, H&P , NPO status , Patient's Chart, lab work & pertinent test results  Airway Mallampati: II TM Distance: >3 FB Neck ROM: full    Dental No notable dental hx. (+) Teeth Intact   Pulmonary neg pulmonary ROS,  breath sounds clear to auscultation  Pulmonary exam normal       Cardiovascular negative cardio ROS  Rhythm:regular Rate:Normal     Neuro/Psych negative neurological ROS  negative psych ROS   GI/Hepatic negative GI ROS, Neg liver ROS,   Endo/Other  Morbid obesity  Renal/GU negative Renal ROS  negative genitourinary   Musculoskeletal   Abdominal Normal abdominal exam  (+)   Peds  Hematology negative hematology ROS (+)   Anesthesia Other Findings   Reproductive/Obstetrics negative OB ROS                           Anesthesia Physical Anesthesia Plan  ASA: III  Anesthesia Plan: General ETT   Post-op Pain Management:    Induction:   Airway Management Planned:   Additional Equipment:   Intra-op Plan:   Post-operative Plan:   Informed Consent: I have reviewed the patients History and Physical, chart, labs and discussed the procedure including the risks, benefits and alternatives for the proposed anesthesia with the patient or authorized representative who has indicated his/her understanding and acceptance.   Dental Advisory Given  Plan Discussed with: Anesthesiologist, CRNA and Surgeon  Anesthesia Plan Comments:         Anesthesia Quick Evaluation

## 2012-02-20 NOTE — Anesthesia Postprocedure Evaluation (Signed)
Anesthesia Post Note  Patient: Wendy Huffman  Procedure(s) Performed: Procedure(s) (LRB): LAPAROSCOPIC TUBAL LIGATION (Bilateral)  Anesthesia type: General  Patient location: PACU  Post pain: Pain level controlled  Post assessment: Post-op Vital signs reviewed  Last Vitals:  Filed Vitals:   02/20/12 1340  BP:   Pulse: 68  Temp:   Resp: 18    Post vital signs: Reviewed  Level of consciousness: sedated  Complications: No apparent anesthesia complicationsfj

## 2012-02-20 NOTE — H&P (Signed)
UPDATE: The patient was examined. The procedure was reviewed. All questions were answered. She is ready to proceed with laparoscopic tubal cautery. BP 134/82  Temp(Src) 98.6 F (37 C) (Oral)  Resp 18  Ht 5\' 6"  (1.676 m)  Wt 286 lb (129.729 kg)  BMI 46.16 kg/m2  SpO2 99%

## 2012-02-23 ENCOUNTER — Encounter (HOSPITAL_COMMUNITY): Payer: Self-pay | Admitting: Obstetrics and Gynecology

## 2012-02-25 ENCOUNTER — Telehealth: Payer: Self-pay | Admitting: Obstetrics and Gynecology

## 2012-02-25 NOTE — Telephone Encounter (Signed)
Tc to pt per telephone call. Told pt ok to resume intercourse at this time per vph. Pt not using bc and has not had a cycle. Informed pt to use condoms with intercourse until after further discussion at post op visit on 03/02/12 with vph. Pt agrees.

## 2012-02-25 NOTE — Telephone Encounter (Signed)
chandra/epic 

## 2012-03-02 ENCOUNTER — Encounter: Payer: Self-pay | Admitting: Obstetrics and Gynecology

## 2012-03-02 ENCOUNTER — Ambulatory Visit (INDEPENDENT_AMBULATORY_CARE_PROVIDER_SITE_OTHER): Payer: 59 | Admitting: Obstetrics and Gynecology

## 2012-03-02 VITALS — BP 120/72 | Temp 98.9°F | Resp 16 | Wt 284.0 lb

## 2012-03-02 DIAGNOSIS — Z302 Encounter for sterilization: Secondary | ICD-10-CM

## 2012-03-02 DIAGNOSIS — D259 Leiomyoma of uterus, unspecified: Secondary | ICD-10-CM

## 2012-03-02 DIAGNOSIS — D219 Benign neoplasm of connective and other soft tissue, unspecified: Secondary | ICD-10-CM | POA: Insufficient documentation

## 2012-03-02 NOTE — Progress Notes (Signed)
DATE OF SURGERY: 02/20/2012 TYPE OF SURGERY:Laproscopic Tubal Ligation HPI PAIN:No VAG BLEEDING: yes on and off VAG DISCHARGE: no NORMAL GI FUNCTN: yes NORMAL GU FUNCTN: yes  Subjective:     Janice Bodine is a 46 y.o. female who presents for post-op visit.from laparoscopic tubal cautery.she denies nausea vomiting constipation or diarrhea dysuria or urinary frequency   The following portions of the patient's history were reviewed and updated as appropriate: allergies, current medications, past family history, past medical history, past social history, past surgical history and problem list.  Review of Systems Pertinent items are noted in HPI.   Objective:    BP 120/72  Temp 98.9 F (37.2 C)  Resp 16  Wt 284 lb (128.822 kg)  LMP 02/26/2012 Weight:  Wt Readings from Last 1 Encounters:  03/02/12 284 lb (128.822 kg)    BMI: There is no height on file to calculate BMI.  General Appearance: Alert, appropriate appearance for age. No acute distress   Gastrointestinal: Soft, non-tender, no masses or organomegaly Incision/s: healing well Pelvic Exam: External genitalia Bartholin's urethra and Skene's within normal limits   Vagina blood in the vault no lesions   Cervix no lesion   Uterus 10-12 week size mobile and nontender   Adnexa no lesions     Assessment:    Doing well postoperatively. Operative findings again reviewed.   Plan:    1. Continue any current medications. 2. Wound care discussed. 3. Activity restrictions: none 4. Anticipated return to work: now. 5. Follow up: for annual    Taysia Rivere P MD 6/11/20133:27 PM

## 2012-06-22 ENCOUNTER — Encounter: Payer: Self-pay | Admitting: Obstetrics and Gynecology

## 2012-06-22 ENCOUNTER — Ambulatory Visit (INDEPENDENT_AMBULATORY_CARE_PROVIDER_SITE_OTHER): Payer: 59 | Admitting: Obstetrics and Gynecology

## 2012-06-22 VITALS — BP 132/72 | HR 90 | Resp 16 | Ht 66.0 in | Wt 290.0 lb

## 2012-06-22 DIAGNOSIS — Z3009 Encounter for other general counseling and advice on contraception: Secondary | ICD-10-CM

## 2012-06-22 DIAGNOSIS — Z01419 Encounter for gynecological examination (general) (routine) without abnormal findings: Secondary | ICD-10-CM

## 2012-06-22 DIAGNOSIS — D219 Benign neoplasm of connective and other soft tissue, unspecified: Secondary | ICD-10-CM

## 2012-06-22 DIAGNOSIS — D259 Leiomyoma of uterus, unspecified: Secondary | ICD-10-CM

## 2012-06-22 NOTE — Progress Notes (Signed)
Last Pap: 06-2010 WNL: Yes Regular Periods:yes Contraception: BTL  Monthly Breast exam:yes Tetanus<58yrs:yes Nl.Bladder Function:yes Daily BMs:yes Healthy Diet:yes Calcium:no Mammogram:yes Date of Mammogram: 07/21/2011 "WNL" Exercise:yes Have often Exercise: x 2 a week Seatbelt: yes Abuse at home: no Stressful work:no Sigmoid-colonoscopy: N/A Bone Density: No PCP: N/A pt does not have a PCP at this time.          Sees Dr. Nita Sells for dermatology Change in PMH: No Changes Change in FMH:No Changes   Subjective:    Wendy Huffman is a 46 y.o. female, G2P2002, who presents for an annual exam.     History   Social History  . Marital Status: Legally Separated    Spouse Name: N/A    Number of Children: N/A  . Years of Education: N/A   Social History Main Topics  . Smoking status: Never Smoker   . Smokeless tobacco: Never Used  . Alcohol Use: Yes     occasional  . Drug Use: No  . Sexually Active: Yes    Birth Control/ Protection: Surgical     vas   Other Topics Concern  . None   Social History Narrative  . None    Menstrual cycle:   LMP: Patient's last menstrual period was 06/08/2012.           Cycle: monthly  For 4 days  The following portions of the patient's history were reviewed and updated as appropriate: allergies, current medications, past family history, past medical history, past social history, past surgical history and problem list.  Review of Systems Pertinent items are noted in HPI. Breast:Negative for breast lump,nipple discharge or nipple retraction Gastrointestinal: Negative for abdominal pain, change in bowel habits or rectal bleeding Urinary:negative   Objective:    BP 132/72  Pulse 90  Resp 16  Ht 5\' 6"  (1.676 m)  Wt 290 lb (131.543 kg)  BMI 46.81 kg/m2  LMP 06/08/2012    Weight:  Wt Readings from Last 1 Encounters:  06/22/12 290 lb (131.543 kg)          BMI: Body mass index is 46.81 kg/(m^2).  General Appearance: Alert,  appropriate appearance for age. No acute distress HEENT: Grossly normal Neck / Thyroid: Supple, no masses, nodes or enlargement Lungs: clear to auscultation bilaterally Back: No CVA tenderness Breast Exam: No masses or nodes.No dimpling, nipple retraction or discharge. Cardiovascular: Regular rate and rhythm. S1, S2, no murmur Gastrointestinal: Soft, non-tender, no masses or organomegaly Pelvic Exam: External genitalia: eczematoid changes Urinary system: urethral meatus normal Vaginal: normal mucosa without prolapse or lesions Cervix: normal appearance Adnexa: non palpable Uterus: doesnt feel enlarged Exam limited by body habitus Rectovaginal: normal rectal, no masses Lymphatic Exam: Non-palpable nodes in neck, clavicular, axillary, or inguinal regions  Skin: eczematoid lesions under breasts, over abdomen, in vulvar regions Neurologic: Normal gait and speech, no tremor  Psychiatric: Alert and oriented, appropriate affect.   Wet Prep:not applicable Urinalysis:not applicable UPT: Not done   Assessment:    Normal gyn exam extentsive eczema    Plan:    mammogram pap smear due 2014. return annually or prn STD screening: declined Contraception:bilateral tubal ligation      Chestine Spore, LatoyaMD

## 2012-06-23 ENCOUNTER — Other Ambulatory Visit: Payer: Self-pay | Admitting: Obstetrics and Gynecology

## 2012-06-23 DIAGNOSIS — Z1231 Encounter for screening mammogram for malignant neoplasm of breast: Secondary | ICD-10-CM

## 2012-07-21 ENCOUNTER — Ambulatory Visit
Admission: RE | Admit: 2012-07-21 | Discharge: 2012-07-21 | Disposition: A | Discharge status: Home / Self Care | Payer: 59 | Source: Ambulatory Visit | Attending: Obstetrics and Gynecology | Admitting: Obstetrics and Gynecology

## 2012-07-21 DIAGNOSIS — Z1231 Encounter for screening mammogram for malignant neoplasm of breast: Secondary | ICD-10-CM

## 2012-10-16 ENCOUNTER — Encounter (HOSPITAL_COMMUNITY): Payer: Self-pay | Admitting: *Deleted

## 2012-10-16 ENCOUNTER — Emergency Department (HOSPITAL_COMMUNITY)
Admission: EM | Admit: 2012-10-16 | Discharge: 2012-10-16 | Disposition: A | Discharge status: Home / Self Care | Payer: 59 | Attending: Emergency Medicine | Admitting: Emergency Medicine

## 2012-10-16 ENCOUNTER — Emergency Department (HOSPITAL_COMMUNITY): Payer: 59

## 2012-10-16 DIAGNOSIS — R0989 Other specified symptoms and signs involving the circulatory and respiratory systems: Secondary | ICD-10-CM | POA: Insufficient documentation

## 2012-10-16 DIAGNOSIS — R0600 Dyspnea, unspecified: Secondary | ICD-10-CM

## 2012-10-16 DIAGNOSIS — Z862 Personal history of diseases of the blood and blood-forming organs and certain disorders involving the immune mechanism: Secondary | ICD-10-CM | POA: Insufficient documentation

## 2012-10-16 DIAGNOSIS — M129 Arthropathy, unspecified: Secondary | ICD-10-CM | POA: Insufficient documentation

## 2012-10-16 DIAGNOSIS — Z8739 Personal history of other diseases of the musculoskeletal system and connective tissue: Secondary | ICD-10-CM | POA: Insufficient documentation

## 2012-10-16 DIAGNOSIS — L259 Unspecified contact dermatitis, unspecified cause: Secondary | ICD-10-CM | POA: Insufficient documentation

## 2012-10-16 DIAGNOSIS — Z79899 Other long term (current) drug therapy: Secondary | ICD-10-CM | POA: Insufficient documentation

## 2012-10-16 DIAGNOSIS — Z87448 Personal history of other diseases of urinary system: Secondary | ICD-10-CM | POA: Insufficient documentation

## 2012-10-16 DIAGNOSIS — Z8619 Personal history of other infectious and parasitic diseases: Secondary | ICD-10-CM | POA: Insufficient documentation

## 2012-10-16 DIAGNOSIS — R4701 Aphasia: Secondary | ICD-10-CM | POA: Insufficient documentation

## 2012-10-16 DIAGNOSIS — R0609 Other forms of dyspnea: Secondary | ICD-10-CM | POA: Insufficient documentation

## 2012-10-16 LAB — CBC WITH DIFFERENTIAL/PLATELET
Basophils Relative: 0 % (ref 0–1)
Eosinophils Relative: 1 % (ref 0–5)
Hemoglobin: 9.4 g/dL — ABNORMAL LOW (ref 12.0–15.0)
Lymphocytes Relative: 21 % (ref 12–46)
Monocytes Relative: 7 % (ref 3–12)
Neutrophils Relative %: 71 % (ref 43–77)
Platelets: 318 10*3/uL (ref 150–400)
RBC: 4.57 MIL/uL (ref 3.87–5.11)
WBC: 11.4 10*3/uL — ABNORMAL HIGH (ref 4.0–10.5)

## 2012-10-16 LAB — COMPREHENSIVE METABOLIC PANEL
ALT: 15 U/L (ref 0–35)
AST: 16 U/L (ref 0–37)
Alkaline Phosphatase: 145 U/L — ABNORMAL HIGH (ref 39–117)
CO2: 23 mEq/L (ref 19–32)
GFR calc Af Amer: >90 mL/min (ref >90)
GFR calc non Af Amer: >90 mL/min (ref >90)
Glucose, Bld: 164 mg/dL — ABNORMAL HIGH (ref 70–99)
Potassium: 3.2 mEq/L — ABNORMAL LOW (ref 3.5–5.1)
Sodium: 136 mEq/L (ref 135–145)

## 2012-10-16 LAB — GLUCOSE, CAPILLARY: Glucose-Capillary: 180 mg/dL — ABNORMAL HIGH (ref 70–99)

## 2012-10-16 MED ORDER — POTASSIUM CHLORIDE CRYS ER 20 MEQ PO TBCR
40.0000 meq | EXTENDED_RELEASE_TABLET | Freq: Once | ORAL | Status: AC
  Administered 2012-10-16: 40 meq via ORAL
  Filled 2012-10-16: qty 2

## 2012-10-16 NOTE — ED Provider Notes (Addendum)
History     CSN: [id number]  Arrival date & time 10/16/12  1802   First MD Initiated Contact with Patient 10/16/12 1913      Chief Complaint  Patient presents with  . Shortness of Breath  . Aphasia    (Consider location/radiation/quality/duration/timing/severity/associated sxs/prior treatment) HPI Comments: Wendy Huffman is a 47 y.o. female patient was at home tonight had sudden onset of shortness of breath. Since arriving in the ED, and being placed on oxygen; her symptom has resolved. There is no associated weakness, dizziness, nausea, vomiting,  or chest pain. She has never had this before. She is under severe stress due to to financial problems. She works as a Teacher, music. There are no known modifying factors.  The history is provided by the patient.    Past Medical History  Diagnosis Date  . Abnormal Pap smear   . Anemia   . Fibroid     history   . H/O: pituitary tumor 2005  . Scoliosis     DX as a child back pain  . STD (female)     H/O of std's   . Arthritis     knee  . Eczema     Past Surgical History  Procedure Date  . Cesarean section   . Knee surgery 2000    left  knee   . Cesarean section x2  . Wisdom tooth extraction 80's  . Laparoscopic tubal ligation 02/20/2012    Procedure: LAPAROSCOPIC TUBAL LIGATION;  Surgeon: Hal Morales, MD;  Location: [facility];  Service: Gynecology;  Laterality: Bilateral;    Family History  Problem Relation Age of Onset  . Lung disease Mother 43    past away  . Diabetes Maternal Aunt   . Diabetes Maternal Grandmother   . Heart disease Paternal Grandfather     History  Substance Use Topics  . Smoking status: Never Smoker   . Smokeless tobacco: Never Used  . Alcohol Use: Yes     Comment: occasional    OB History    Grav Para Term Preterm Abortions TAB SAB Ect Mult Living   2 2 2       2       Review of Systems  All other systems reviewed and are negative.    Allergies  Review of patient's allergies  indicates no known allergies.  Home Medications   Current Outpatient Rx  Name  Route  Sig  Dispense  Refill  . ACETAMINOPHEN 500 MG PO TABS   Oral   Take 500 mg by mouth every 6 (six) hours as needed. pain         . CELECOXIB 200 MG PO CAPS   Oral   Take 200 mg by mouth 2 (two) times daily as needed. For pain         . DESONIDE 0.05 % EX OINT   Topical   Apply 1 application topically 2 (two) times daily.         . TRIAMCINOLONE ACETONIDE 0.1 % EX CREA   Topical   Apply 1 application topically 2 (two) times daily.           BP 121/69  Pulse 85  Temp 98 F (36.7 C) (Oral)  Resp 14  SpO2 100%  LMP 10/03/2012  Physical Exam  Nursing note and vitals reviewed. Constitutional: She is oriented to person, place, and time. She appears well-developed and well-nourished.  HENT:  Head: Normocephalic and atraumatic.  Eyes: Conjunctivae normal and EOM  are normal. Pupils are equal, round, and reactive to light.  Neck: Normal range of motion and phonation normal. Neck supple.  Cardiovascular: Normal rate, regular rhythm and intact distal pulses.   Pulmonary/Chest: Effort normal and breath sounds normal. She exhibits no tenderness.  Abdominal: Soft. She exhibits no distension. There is no tenderness. There is no guarding.  Musculoskeletal: Normal range of motion.  Neurological: She is alert and oriented to person, place, and time. She has normal strength. She exhibits normal muscle tone.  Skin: Skin is warm and dry.  Psychiatric: She has a normal mood and affect. Her behavior is normal. Judgment and thought content normal.    ED Course  Procedures (including critical care time)   Oral potassium given  Reevaluation: 20:45- patient is calm, and comfortable. Repeat vital signs are normal. Her tachypnea and hypertension, resolved spontaneously      Date: 07/09/2012  Rate: 82  Rhythm: normal sinus rhythm  QRS Axis: normal  PR and QT Intervals: normal  ST/T Wave  abnormalities: normal  PR and QRS Conduction Disutrbances:none  Narrative Interpretation:   Old EKG Reviewed: none available   Labs Reviewed  GLUCOSE, CAPILLARY - Abnormal; Notable for the following:    Glucose-Capillary 180 (*)     All other components within normal limits  CBC WITH DIFFERENTIAL - Abnormal; Notable for the following:    WBC 11.4 (*)     Hemoglobin 9.4 (*)     HCT 31.2 (*)     MCV 68.3 (*)     MCH 20.6 (*)     RDW 17.4 (*)     Neutro Abs 8.1 (*)     All other components within normal limits  COMPREHENSIVE METABOLIC PANEL - Abnormal; Notable for the following:    Potassium 3.2 (*)     Glucose, Bld 164 (*)     Albumin 3.1 (*)     Alkaline Phosphatase 145 (*)     All other components within normal limits   Dg Chest 2 View  10/16/2012  *RADIOLOGY REPORT*  Clinical Data: Shortness of breath.  Rapid heart rate.  CHEST - 2 VIEW  Comparison: None.  Findings: Lungs clear.  Heart size normal.  No pneumothorax or pleural fluid.  Degenerative change about the shoulders noted.  IMPRESSION: No acute disease.   Original Report Authenticated By: Holley Dexter, M.D.    Nursing notes, applicable records and vitals reviewed.  Radiologic Images/Reports reviewed.   1. Dyspnea       MDM  Nonspecific, shortness of breath, with spontaneous improvement. Possible stress reaction. Doubt PE, ACS, pneumonia. She is stable for discharge.    Plan: Home Medications- none; Home Treatments- rest; Recommended follow up- PCP, of choice, as needed      Flint Melter, MD 10/17/12 0024  Flint Melter, MD 10/17/12 [id number]

## 2012-10-16 NOTE — ED Notes (Addendum)
Pt in c/o shortness of breath, onset 30 min ago, after shortness of breath pt developed stuttering noted by family, pt appears to be having trouble forming words, unable to express if the shortness of breath is causing this, pt appears anxious, breathing unlabored until she attempts to talk, then pt begins to hyperventilate while attempting to form words, also noted right hand twitching, HR and oxygen level WNL.

## 2012-10-16 NOTE — ED Notes (Signed)
Patient has removed O2. SPO2 remains 100%.

## 2012-10-16 NOTE — ED Notes (Signed)
PA Lawyer informed of patient symptoms.

## 2012-11-01 ENCOUNTER — Encounter: Payer: Self-pay | Admitting: Obstetrics and Gynecology

## 2012-11-01 ENCOUNTER — Ambulatory Visit: Payer: 59 | Admitting: Obstetrics and Gynecology

## 2012-11-01 VITALS — BP 118/74 | Temp 98.0°F | Wt 288.0 lb

## 2012-11-01 DIAGNOSIS — R350 Frequency of micturition: Secondary | ICD-10-CM

## 2012-11-01 DIAGNOSIS — N390 Urinary tract infection, site not specified: Secondary | ICD-10-CM

## 2012-11-01 LAB — POCT URINALYSIS DIPSTICK
Bilirubin, UA: NEGATIVE
Glucose, UA: NEGATIVE
Nitrite, UA: NEGATIVE
Spec Grav, UA: 1.015
Urobilinogen, UA: NEGATIVE
pH, UA: 6

## 2012-11-01 MED ORDER — CIPROFLOXACIN HCL 500 MG PO TABS: 500.0000 mg | ORAL_TABLET | Freq: Two times a day (BID) | ORAL | Status: DC

## 2012-11-01 NOTE — Progress Notes (Signed)
47 YO with UTI symptoms x 1 week.  Denies pelvic pain, fever, vaginitis symptoms but does have lower back pain.  O: Back: no CVA tenderness      Abdomen: soft, non-tender   U/A:  ph-6.0,  SG-1.015, leukocytes 2+,  and blood 2+  A: UTI  P: Cipro 500 mg  #6 bid x 3 days      Increase liquid & water intake      Urine for Culture      RTO-as scheduled or prn  Daune Divirgilio, PA-C

## 2012-11-01 NOTE — Progress Notes (Signed)
Contraception: tubal ligation History of STD:  no history of PID, STD's History of ovarian cyst: no History of fibroids: yes:   History of endometriosis:no Previous ultrasound:no  Urinary symptoms: urinary frequency and BURNING WITH URINATION, BACK PAIN; PT USE AZO;LITTLE RELIEF Gastro-intestinal symptoms:  Constipation: no     Diarrhea: no     Nausea: no     Vomiting: no     Fever: no Vaginal discharge: no vaginal discharge

## 2012-11-03 LAB — URINE CULTURE

## 2013-02-08 ENCOUNTER — Other Ambulatory Visit: Payer: Self-pay | Admitting: Obstetrics and Gynecology

## 2013-02-08 DIAGNOSIS — N61 Mastitis without abscess: Secondary | ICD-10-CM

## 2013-02-15 ENCOUNTER — Ambulatory Visit
Admission: RE | Admit: 2013-02-15 | Discharge: 2013-02-15 | Disposition: A | Discharge status: Home / Self Care | Payer: 59 | Source: Ambulatory Visit | Attending: Obstetrics and Gynecology | Admitting: Obstetrics and Gynecology

## 2013-02-15 ENCOUNTER — Other Ambulatory Visit: Payer: Self-pay | Admitting: Obstetrics and Gynecology

## 2013-02-15 DIAGNOSIS — R599 Enlarged lymph nodes, unspecified: Secondary | ICD-10-CM

## 2013-02-15 DIAGNOSIS — N61 Mastitis without abscess: Secondary | ICD-10-CM

## 2013-02-15 DIAGNOSIS — N63 Unspecified lump in unspecified breast: Secondary | ICD-10-CM

## 2013-02-16 ENCOUNTER — Other Ambulatory Visit: Payer: 59

## 2013-02-22 ENCOUNTER — Other Ambulatory Visit: Payer: 59

## 2013-02-23 ENCOUNTER — Ambulatory Visit
Admission: RE | Admit: 2013-02-23 | Discharge: 2013-02-23 | Disposition: A | Discharge status: Home / Self Care | Payer: 59 | Source: Ambulatory Visit | Attending: Obstetrics and Gynecology | Admitting: Obstetrics and Gynecology

## 2013-02-23 ENCOUNTER — Other Ambulatory Visit: Payer: Self-pay | Admitting: Obstetrics and Gynecology

## 2013-02-23 DIAGNOSIS — R599 Enlarged lymph nodes, unspecified: Secondary | ICD-10-CM

## 2013-02-23 DIAGNOSIS — N61 Mastitis without abscess: Secondary | ICD-10-CM

## 2013-02-23 DIAGNOSIS — N63 Unspecified lump in unspecified breast: Secondary | ICD-10-CM

## 2013-02-24 ENCOUNTER — Ambulatory Visit
Admission: RE | Admit: 2013-02-24 | Discharge: 2013-02-24 | Disposition: A | Discharge status: Home / Self Care | Payer: 59 | Source: Ambulatory Visit | Attending: Obstetrics and Gynecology | Admitting: Obstetrics and Gynecology

## 2013-02-24 DIAGNOSIS — R599 Enlarged lymph nodes, unspecified: Secondary | ICD-10-CM

## 2013-06-20 ENCOUNTER — Other Ambulatory Visit: Payer: Self-pay | Admitting: Obstetrics and Gynecology

## 2013-06-20 DIAGNOSIS — N63 Unspecified lump in unspecified breast: Secondary | ICD-10-CM

## 2013-06-23 ENCOUNTER — Other Ambulatory Visit: Payer: Self-pay | Admitting: Obstetrics and Gynecology

## 2013-06-23 ENCOUNTER — Other Ambulatory Visit: Payer: Self-pay

## 2013-06-23 DIAGNOSIS — N63 Unspecified lump in unspecified breast: Secondary | ICD-10-CM

## 2013-06-30 ENCOUNTER — Other Ambulatory Visit: Payer: 59

## 2013-07-08 ENCOUNTER — Ambulatory Visit
Admission: RE | Admit: 2013-07-08 | Discharge: 2013-07-08 | Disposition: A | Discharge status: Home / Self Care | Payer: 59 | Source: Ambulatory Visit | Attending: Obstetrics and Gynecology | Admitting: Obstetrics and Gynecology

## 2013-07-08 ENCOUNTER — Other Ambulatory Visit: Payer: Self-pay | Admitting: Obstetrics and Gynecology

## 2013-07-08 DIAGNOSIS — N63 Unspecified lump in unspecified breast: Secondary | ICD-10-CM

## 2014-05-15 IMAGING — CR DG CHEST 2V
2 series · 2 of 2 positions shown · non-contrast
Comparison: None.

CLINICAL DATA: Shortness of breath.  Rapid heart rate.

CHEST - 2 VIEW

[w chest pa]
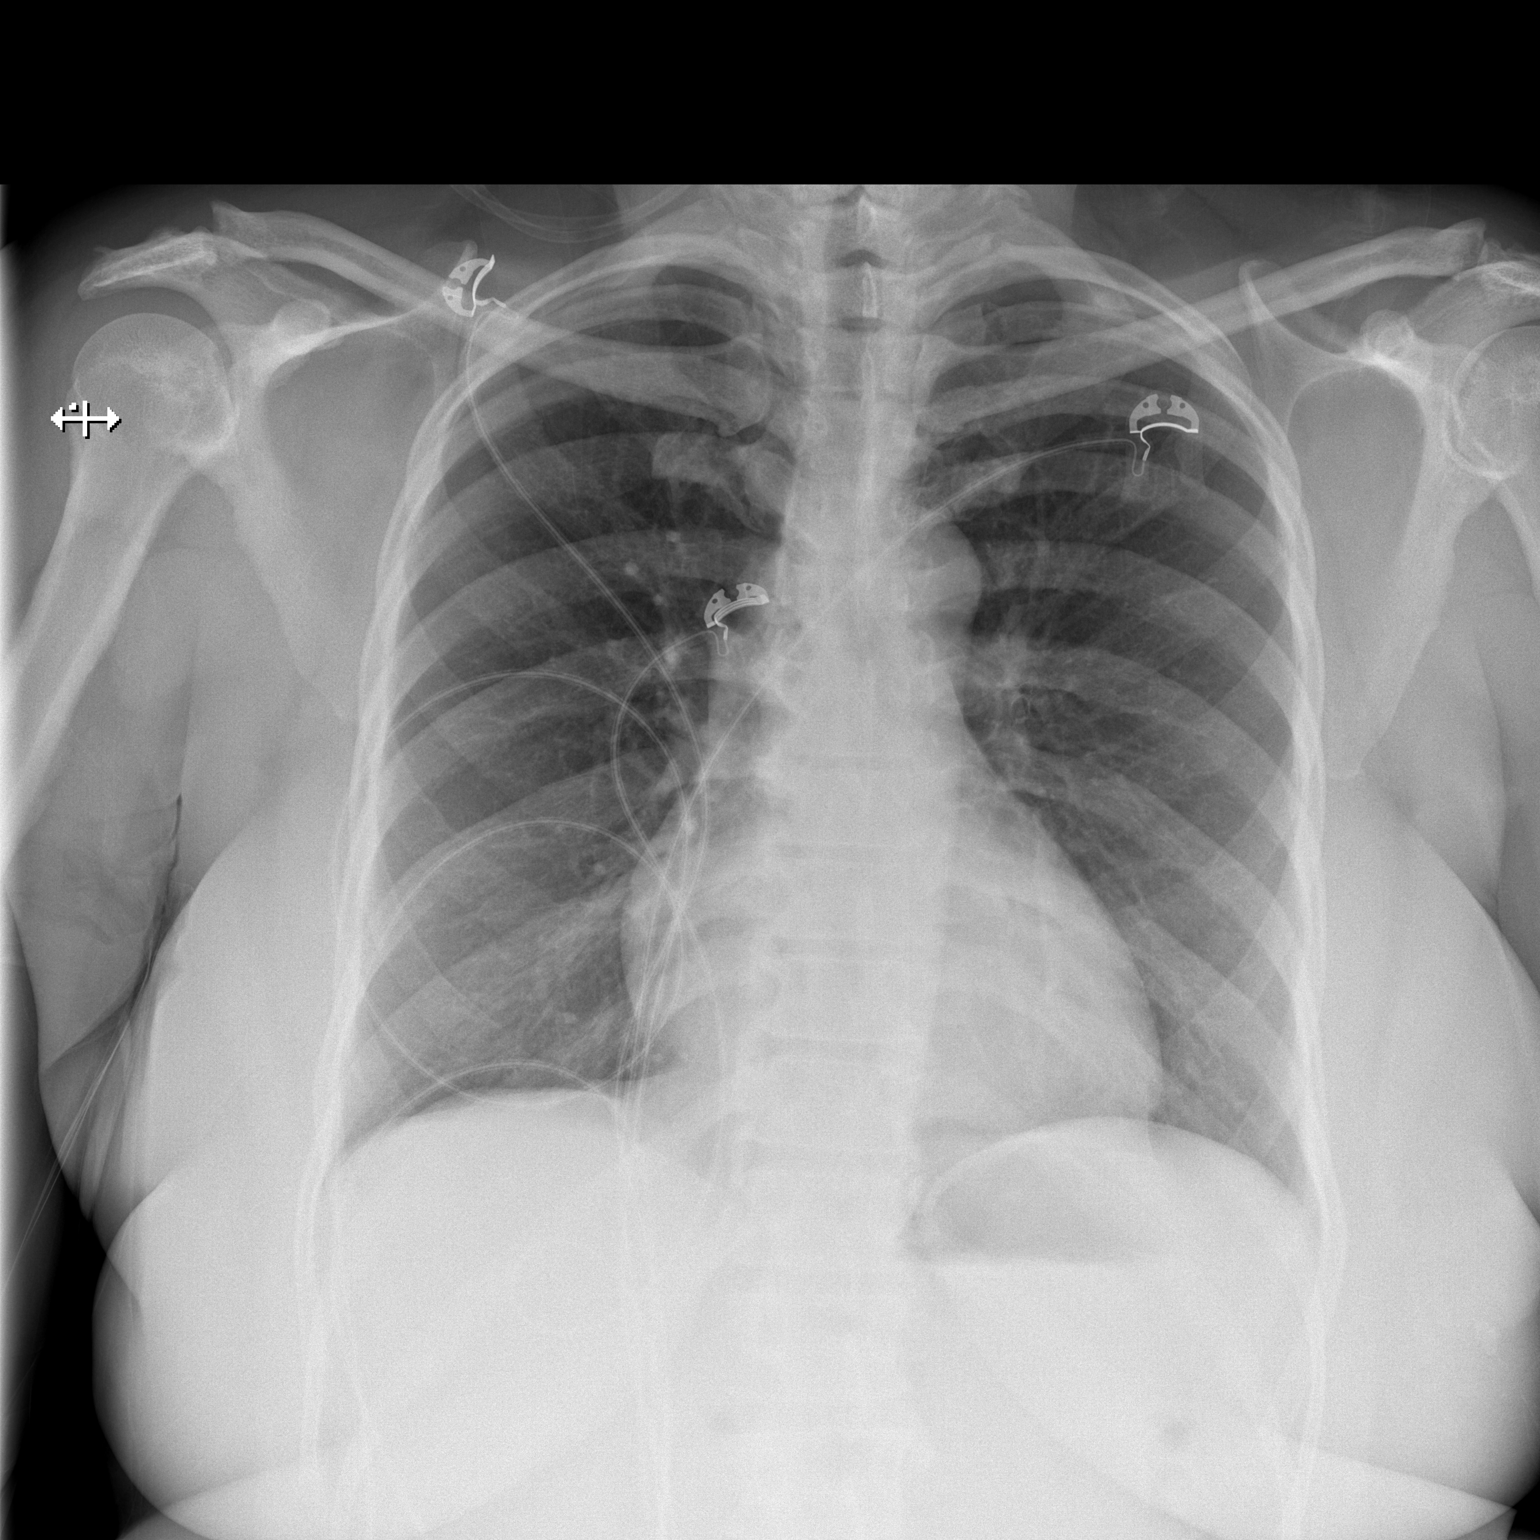

[w chest lat]
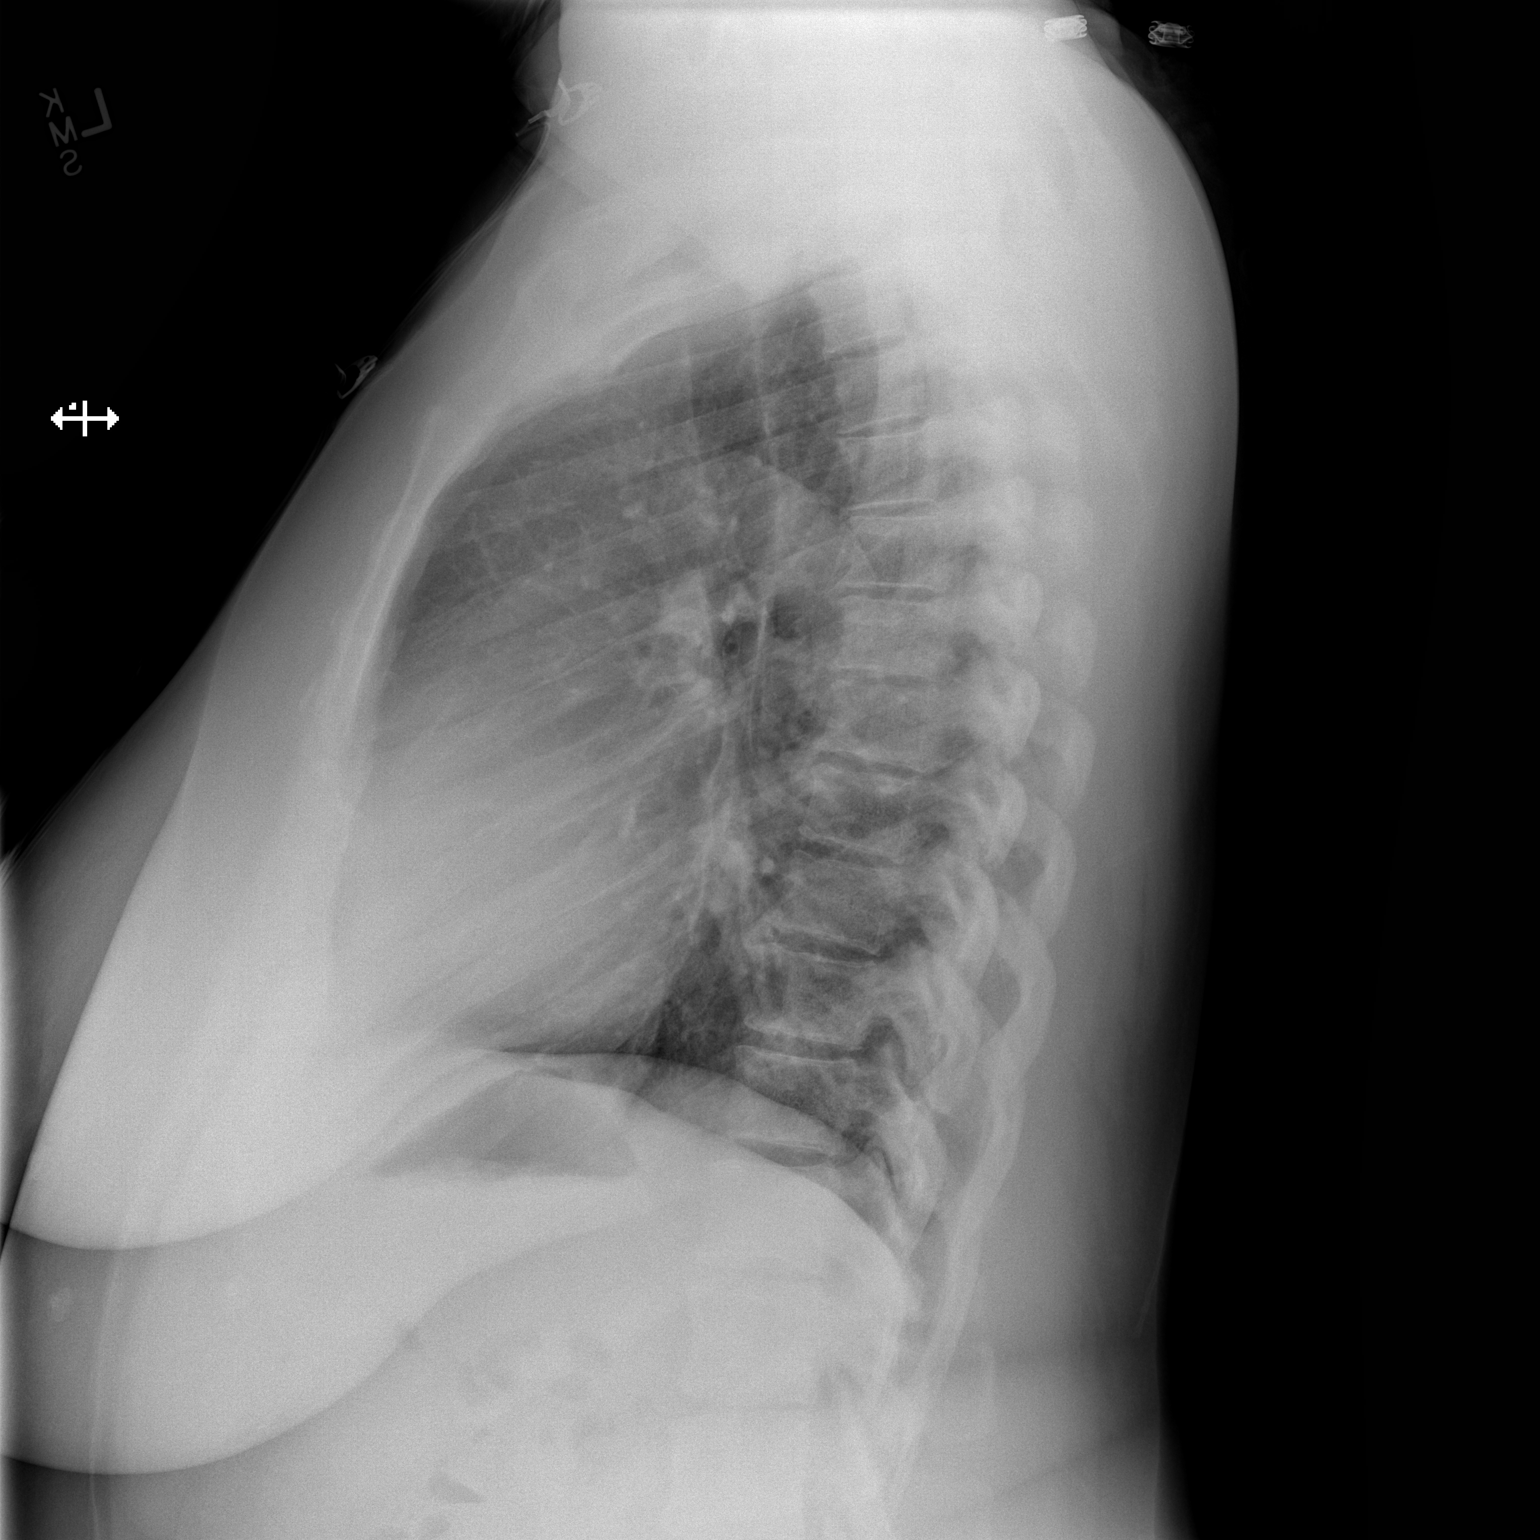

[2 of 2 positions shown; findings below may reference images not displayed]

FINDINGS: Lungs clear.  Heart size normal.  No pneumothorax or
pleural fluid.  Degenerative change about the shoulders noted.
IMPRESSION: No acute disease.

## 2014-06-20 ENCOUNTER — Other Ambulatory Visit: Payer: Self-pay

## 2014-06-20 DIAGNOSIS — Z1231 Encounter for screening mammogram for malignant neoplasm of breast: Secondary | ICD-10-CM

## 2014-07-18 ENCOUNTER — Ambulatory Visit
Admission: RE | Admit: 2014-07-18 | Discharge: 2014-07-18 | Disposition: A | Discharge status: Home / Self Care | Payer: 59 | Source: Ambulatory Visit

## 2014-07-18 ENCOUNTER — Encounter (INDEPENDENT_AMBULATORY_CARE_PROVIDER_SITE_OTHER): Payer: Self-pay

## 2014-07-18 DIAGNOSIS — Z1231 Encounter for screening mammogram for malignant neoplasm of breast: Secondary | ICD-10-CM

## 2014-07-24 ENCOUNTER — Encounter: Payer: Self-pay | Admitting: Obstetrics and Gynecology

## 2014-09-28 ENCOUNTER — Other Ambulatory Visit: Payer: Self-pay | Admitting: Endocrinology

## 2014-09-28 DIAGNOSIS — Z87898 Personal history of other specified conditions: Secondary | ICD-10-CM

## 2014-10-09 ENCOUNTER — Ambulatory Visit
Admission: RE | Admit: 2014-10-09 | Discharge: 2014-10-09 | Disposition: A | Discharge status: Home / Self Care | Payer: 59 | Source: Ambulatory Visit | Attending: Endocrinology | Admitting: Endocrinology

## 2014-10-09 DIAGNOSIS — Z87898 Personal history of other specified conditions: Secondary | ICD-10-CM

## 2014-10-09 MED ORDER — GADOBENATE DIMEGLUMINE 529 MG/ML IV SOLN
15.0000 mL | Freq: Once | INTRAVENOUS | Status: AC | PRN
  Administered 2014-10-09: 15 mL via INTRAVENOUS

## 2015-02-15 ENCOUNTER — Telehealth: Payer: Self-pay | Admitting: Oncology

## 2015-02-15 NOTE — Telephone Encounter (Signed)
new patient appt-s/w patient and gave np appt for 06/23 @ 1:30 w/Dr. Alen Blew.Marland Kitchen Referring Dr. Deland Pretty Dx-Anemia

## 2015-03-15 ENCOUNTER — Encounter: Payer: Self-pay | Admitting: Oncology

## 2015-03-15 ENCOUNTER — Ambulatory Visit (HOSPITAL_BASED_OUTPATIENT_CLINIC_OR_DEPARTMENT_OTHER): Payer: 59 | Admitting: Oncology

## 2015-03-15 ENCOUNTER — Encounter (INDEPENDENT_AMBULATORY_CARE_PROVIDER_SITE_OTHER): Payer: Self-pay

## 2015-03-15 ENCOUNTER — Ambulatory Visit: Payer: 59

## 2015-03-15 ENCOUNTER — Other Ambulatory Visit: Payer: 59

## 2015-03-15 ENCOUNTER — Telehealth: Payer: Self-pay | Admitting: Oncology

## 2015-03-15 VITALS — BP 132/78 | HR 67 | Temp 98.1°F | Resp 16 | Ht 66.0 in | Wt 283.1 lb

## 2015-03-15 DIAGNOSIS — D259 Leiomyoma of uterus, unspecified: Secondary | ICD-10-CM | POA: Diagnosis not present

## 2015-03-15 DIAGNOSIS — D509 Iron deficiency anemia, unspecified: Secondary | ICD-10-CM

## 2015-03-15 DIAGNOSIS — E669 Obesity, unspecified: Secondary | ICD-10-CM

## 2015-03-15 DIAGNOSIS — D508 Other iron deficiency anemias: Secondary | ICD-10-CM

## 2015-03-15 NOTE — Progress Notes (Signed)
Please see consult note.  

## 2015-03-15 NOTE — Telephone Encounter (Signed)
per pof to sch pt appt-gave pt copy of avs °

## 2015-03-15 NOTE — Consult Note (Signed)
Reason for Referral: Iron deficiency anemia   HPI: 49 year old woman with history of obesity, arthritis but very few comorbid conditions referred today for evaluation of anemia. She has a known history of uterine fibroids although her menses have not been heavy recently. She have had regular menstrual cycles with only heavy early days without any clots or menorrhagia. She also have donated blood on multiple occasions but have not been able to because of anemia. She was found to have worsening anemia on his CBC on lumbar 2015. At that time her hemoglobin was 9.6 with an MCV of 66.7 and otherwise normal CBC. In January 2016 her hemoglobin was 10.5 with an MCV of 69. At some point she will start taking iron replacement and her hemoglobin was up to 11.2 in February 2016 and in May 2016 her hemoglobin was 11 with an MCV up to 71.1 and normal counts otherwise. This should referred to me for further evaluation and possible treatment. Clinically, she reports no other complaints. She did have a GI workup that ruled out any GI blood losses. She does not report any hematochezia or melanoma. She does report menstrual cycles that are heavy only for the first day. She does not report any epistaxis or genitourinary bleeding. He did not report any major problems with oral iron. She does not miss many doses and taken with vitamin C. She did not report any abdominal pain or reflux symptoms.  She does not report any headaches, blurry vision, syncope or seizures. She does not report any fevers, chills, sweats, weight loss or appetite changes. He does not report any chest pain, palpitation, orthopnea or leg edema. She does not report any cough, wheezing or difficulty breathing. She does not report any nausea, vomiting, abdominal pain, dyspepsia, constipation or diarrhea. She does not report any frequency, urgency or hesitancy. She does not report any skeletal complaints. Mammograms review of systems unremarkable.   Past Medical  History  Diagnosis Date  . Abnormal Pap smear   . Anemia   . Fibroid     history   . H/O: pituitary tumor 2005  . Scoliosis     DX as a child back pain  . STD (female)     H/O of std's   . Arthritis     knee  . Eczema   :  Past Surgical History  Procedure Laterality Date  . Cesarean section    . Knee surgery  2000    left  knee   . Cesarean section  x2  . Wisdom tooth extraction  80's  . Laparoscopic tubal ligation  02/20/2012    Procedure: LAPAROSCOPIC TUBAL LIGATION;  Surgeon: Eldred Manges, MD;  Location: [facility];  Service: Gynecology;  Laterality: Bilateral;  :   Current outpatient prescriptions:  .  desonide (DESOWEN) 0.05 % ointment, Apply 1 application topically 2 (two) times daily., Disp: , Rfl:  .  oxybutynin (DITROPAN) 5 MG tablet, Take 5 mg by mouth 2 (two) times daily., Disp: , Rfl:  .  triamcinolone cream (KENALOG) 0.1 %, Apply 1 application topically 2 (two) times daily., Disp: , Rfl: :  No Known Allergies:  Family History  Problem Relation Age of Onset  . Lung disease Mother 22    past away  . Diabetes Maternal Aunt   . Diabetes Maternal Grandmother   . Heart disease Paternal Grandfather   :  History   Social History  . Marital Status: Legally Separated    Spouse Name: N/A  .  Number of Children: N/A  . Years of Education: N/A   Occupational History  . Not on file.   Social History Main Topics  . Smoking status: Never Smoker   . Smokeless tobacco: Never Used  . Alcohol Use: Yes     Comment: occasional  . Drug Use: No  . Sexual Activity: Yes    Birth Control/ Protection: Surgical     Comment: BTL   Other Topics Concern  . Not on file   Social History Narrative  :  Pertinent items are noted in HPI.  Exam: ECOG 0  Blood pressure 132/78, pulse 67, temperature 98.1 F (36.7 C), temperature source Oral, resp. rate 16, height 5\' 6"  (1.676 m), weight 283 lb 1.6 oz (128.413 kg), SpO2 100 %. General appearance: alert and  cooperative Head: Normocephalic, without obvious abnormality Throat: lips, mucosa, and tongue normal; teeth and gums normal Neck: no adenopathy Back: negative Resp: clear to auscultation bilaterally Chest wall: no tenderness Cardio: regular rate and rhythm, S1, S2 normal, no murmur, click, rub or gallop GI: soft, non-tender; bowel sounds normal; no masses,  no organomegaly Extremities: extremities normal, atraumatic, no cyanosis or edema Pulses: 2+ and symmetric Skin: Skin color, texture, turgor normal. No rashes or lesions Lymph nodes: Cervical, supraclavicular, and axillary nodes normal. Neurologic: Grossly normal  CBC    Component Value Date/Time   WBC 11.4* 10/16/2012 1839   RBC 4.57 10/16/2012 1839   HGB 9.4* 10/16/2012 1839   HCT 31.2* 10/16/2012 1839   PLT 318 10/16/2012 1839   MCV 68.3* 10/16/2012 1839   MCH 20.6* 10/16/2012 1839   MCHC 30.1 10/16/2012 1839   RDW 17.4* 10/16/2012 1839   LYMPHSABS 2.4 10/16/2012 1839   MONOABS 0.8 10/16/2012 1839   EOSABS 0.1 10/16/2012 1839   BASOSABS 0.0 10/16/2012 1839      Assessment and Plan:    49 year old woman with the following issues:  1. Iron deficiency anemia dating back to at least 2014. At that time her hemoglobin was 9.4, MCV of 68 with an elevated RDW of 17.4. She had been on oral iron replacement last few months with improvement in her hemoglobin up to 11. The etiology of her iron deficiency is likely multifactorial. Her deficiency is related to menstrual blood losses, uterine fibroids and possibly frequent blood donation. She has tolerated oral iron at this time with less than adequate response for the time being.  Options of treatments were discussed today. One option was to continue oral iron as she is doing for a longer period of time. It is very possible that she has an impaired absorption of oral iron and might take a little bit longer to fully replace her deficit.  Alternatively, IV iron in the form of  Feraheme versus other preparations were discussed. Risks and benefits of these preparations were discussed today. Complications of these infusion includes arthralgias, myalgias and rarely anaphylaxis. The benefit would be more predictable and rapid replacement of her IV iron.  After today's discussion, she elected to continue with oral iron at least for the time being. We have agreed to give it at least 3-4 months and then we'll recheck her iron at that time. If she continues to have less than adequate response, she will consider IV iron at that time.  2. Uterine fibroids: She follows up with gynecology at this time regarding this issue. She has not had any major bleeding associated with it and she is mildly symptomatic. If these become an issue from a  bleeding standpoint, IV iron would be more of an option at that time.

## 2015-03-15 NOTE — Progress Notes (Signed)
Checked in new pt with no financial concerns. °

## 2015-05-10 ENCOUNTER — Other Ambulatory Visit: Payer: Self-pay | Admitting: Internal Medicine

## 2015-05-10 DIAGNOSIS — R945 Abnormal results of liver function studies: Principal | ICD-10-CM

## 2015-05-10 DIAGNOSIS — R7989 Other specified abnormal findings of blood chemistry: Secondary | ICD-10-CM

## 2015-07-10 ENCOUNTER — Other Ambulatory Visit: Payer: 59

## 2015-07-12 ENCOUNTER — Ambulatory Visit: Payer: 59 | Admitting: Oncology

## 2016-10-14 DIAGNOSIS — K5904 Chronic idiopathic constipation: Secondary | ICD-10-CM | POA: Diagnosis not present

## 2017-03-23 DIAGNOSIS — M1712 Unilateral primary osteoarthritis, left knee: Secondary | ICD-10-CM | POA: Diagnosis not present

## 2017-03-26 DIAGNOSIS — L2084 Intrinsic (allergic) eczema: Secondary | ICD-10-CM | POA: Diagnosis not present

## 2017-05-09 DIAGNOSIS — D352 Benign neoplasm of pituitary gland: Secondary | ICD-10-CM | POA: Diagnosis not present

## 2017-05-09 DIAGNOSIS — Z0001 Encounter for general adult medical examination with abnormal findings: Secondary | ICD-10-CM | POA: Diagnosis not present

## 2017-05-09 DIAGNOSIS — R748 Abnormal levels of other serum enzymes: Secondary | ICD-10-CM | POA: Diagnosis not present

## 2017-07-29 DIAGNOSIS — M6740 Ganglion, unspecified site: Secondary | ICD-10-CM | POA: Diagnosis not present

## 2017-08-03 DIAGNOSIS — D352 Benign neoplasm of pituitary gland: Secondary | ICD-10-CM | POA: Diagnosis not present

## 2017-08-03 DIAGNOSIS — Z01411 Encounter for gynecological examination (general) (routine) with abnormal findings: Secondary | ICD-10-CM | POA: Diagnosis not present

## 2017-08-03 DIAGNOSIS — Z1231 Encounter for screening mammogram for malignant neoplasm of breast: Secondary | ICD-10-CM | POA: Diagnosis not present

## 2017-09-29 DIAGNOSIS — R748 Abnormal levels of other serum enzymes: Secondary | ICD-10-CM | POA: Diagnosis not present

## 2017-09-29 DIAGNOSIS — D352 Benign neoplasm of pituitary gland: Secondary | ICD-10-CM | POA: Diagnosis not present

## 2017-09-29 DIAGNOSIS — Z23 Encounter for immunization: Secondary | ICD-10-CM | POA: Diagnosis not present

## 2017-10-14 DIAGNOSIS — D352 Benign neoplasm of pituitary gland: Secondary | ICD-10-CM | POA: Diagnosis not present

## 2018-05-04 DIAGNOSIS — D649 Anemia, unspecified: Secondary | ICD-10-CM | POA: Diagnosis not present

## 2018-05-04 DIAGNOSIS — Z1321 Encounter for screening for nutritional disorder: Secondary | ICD-10-CM | POA: Diagnosis not present

## 2018-05-04 DIAGNOSIS — Z0000 Encounter for general adult medical examination without abnormal findings: Secondary | ICD-10-CM | POA: Diagnosis not present

## 2018-05-04 DIAGNOSIS — R748 Abnormal levels of other serum enzymes: Secondary | ICD-10-CM | POA: Diagnosis not present

## 2018-05-11 DIAGNOSIS — L309 Dermatitis, unspecified: Secondary | ICD-10-CM | POA: Diagnosis not present

## 2018-05-11 DIAGNOSIS — Z833 Family history of diabetes mellitus: Secondary | ICD-10-CM | POA: Diagnosis not present

## 2018-05-11 DIAGNOSIS — Z0001 Encounter for general adult medical examination with abnormal findings: Secondary | ICD-10-CM | POA: Diagnosis not present

## 2018-05-18 ENCOUNTER — Other Ambulatory Visit: Payer: Self-pay | Admitting: Oncology

## 2018-05-18 DIAGNOSIS — D509 Iron deficiency anemia, unspecified: Secondary | ICD-10-CM

## 2018-05-19 ENCOUNTER — Telehealth: Payer: Self-pay | Admitting: Oncology

## 2018-05-19 NOTE — Telephone Encounter (Signed)
Patient called to cancel °

## 2018-05-19 NOTE — Telephone Encounter (Signed)
Scheduled appt per 8/27 sch message - left message with appt date and time.  

## 2018-05-21 ENCOUNTER — Ambulatory Visit: Payer: 59 | Admitting: Oncology

## 2018-05-21 ENCOUNTER — Other Ambulatory Visit: Payer: 59

## 2018-08-05 DIAGNOSIS — Z124 Encounter for screening for malignant neoplasm of cervix: Secondary | ICD-10-CM | POA: Diagnosis not present

## 2018-08-05 DIAGNOSIS — Z01411 Encounter for gynecological examination (general) (routine) with abnormal findings: Secondary | ICD-10-CM | POA: Diagnosis not present

## 2018-08-05 DIAGNOSIS — Z6841 Body Mass Index (BMI) 40.0 and over, adult: Secondary | ICD-10-CM | POA: Diagnosis not present

## 2018-08-09 DIAGNOSIS — Z124 Encounter for screening for malignant neoplasm of cervix: Secondary | ICD-10-CM | POA: Diagnosis not present

## 2018-09-17 DIAGNOSIS — D352 Benign neoplasm of pituitary gland: Secondary | ICD-10-CM | POA: Diagnosis not present

## 2018-10-13 ENCOUNTER — Other Ambulatory Visit: Payer: Self-pay | Admitting: Obstetrics and Gynecology

## 2018-10-13 DIAGNOSIS — D352 Benign neoplasm of pituitary gland: Secondary | ICD-10-CM

## 2018-10-23 ENCOUNTER — Ambulatory Visit
Admission: RE | Admit: 2018-10-23 | Discharge: 2018-10-23 | Disposition: A | Discharge status: Home / Self Care | Payer: 59 | Source: Ambulatory Visit | Attending: Obstetrics and Gynecology | Admitting: Obstetrics and Gynecology

## 2018-10-23 DIAGNOSIS — D352 Benign neoplasm of pituitary gland: Secondary | ICD-10-CM | POA: Diagnosis not present

## 2018-10-23 MED ORDER — GADOBENATE DIMEGLUMINE 529 MG/ML IV SOLN
10.0000 mL | Freq: Once | INTRAVENOUS | Status: AC | PRN
  Administered 2018-10-23: 10 mL via INTRAVENOUS

## 2019-08-19 ENCOUNTER — Ambulatory Visit
Admission: EM | Admit: 2019-08-19 | Discharge: 2019-08-19 | Disposition: A | Discharge status: Home / Self Care | Payer: 59

## 2019-08-19 ENCOUNTER — Encounter: Payer: Self-pay | Admitting: Emergency Medicine

## 2019-08-19 ENCOUNTER — Encounter: Payer: Self-pay | Admitting: Obstetrics and Gynecology

## 2019-08-19 ENCOUNTER — Other Ambulatory Visit: Payer: Self-pay

## 2019-08-19 DIAGNOSIS — S29012A Strain of muscle and tendon of back wall of thorax, initial encounter: Secondary | ICD-10-CM

## 2019-08-19 MED ORDER — CYCLOBENZAPRINE HCL 5 MG PO TABS: 5.0000 mg | ORAL_TABLET | Freq: Two times a day (BID) | ORAL | 0 refills | Status: AC | PRN

## 2019-08-19 MED ORDER — NAPROXEN 500 MG PO TABS: 500.0000 mg | ORAL_TABLET | Freq: Two times a day (BID) | ORAL | 0 refills | Status: DC

## 2019-08-19 NOTE — ED Provider Notes (Signed)
[name]    CSN: [id number] Arrival date & time: 08/19/19  [id number]      History   Chief Complaint Chief Complaint  Patient presents with  . Back Pain    HPI Wendy Huffman is a 53 y.o. female with history of arthritis, anemia presenting for mid low back pain since Wednesday.  Patient states that she has gotten the routine of lifting weights before work (works from home, sedentary desk job).  States she was doing weights working her back, doing rows, pushing weight up.  Experienced soreness a few hours later.  No pop/tearing/snapping sensation.  No pain radiation, lower or upper extremity numbness, weakness.  Patient tried Tylenol and expired muscle relaxer without significant relief.     Past Medical History:  Diagnosis Date  . Abnormal Pap smear   . Anemia   . Arthritis    knee  . Eczema   . Fibroid    history   . H/O: pituitary tumor 2005  . Scoliosis    DX as a child back pain  . STD (female)    H/O of std's     Patient Active Problem List   Diagnosis Date Noted  . Fibroids 03/02/2012  . Consultation for sterilization 02/12/2012  . Obesity, Class III, BMI 40-49.9 (morbid obesity) ([place]) 02/12/2012  . Eczema 02/12/2012    Past Surgical History:  Procedure Laterality Date  . BREAST SURGERY    . CESAREAN SECTION    . CESAREAN SECTION  x2  . CESAREAN SECTION    . KNEE ARTHROPLASTY Right   . KNEE SURGERY  2000   left  knee   . LAPAROSCOPIC TUBAL LIGATION  02/20/2012   Procedure: LAPAROSCOPIC TUBAL LIGATION;  Surgeon: Eldred Manges, MD;  Location: [facility];  Service: Gynecology;  Laterality: Bilateral;  . WISDOM TOOTH EXTRACTION  60's    OB History    Gravida  2   Para  2   Term  2   Preterm  0   AB  0   Living  2     SAB  0   TAB  0   Ectopic  0   Multiple      Live Births  2            Home Medications    Prior to Admission medications   Medication Sig Start Date End Date Taking? Authorizing Provider  oxybutynin  (DITROPAN) 5 MG tablet Take 5 mg by mouth 3 (three) times daily.   Yes [provider]  triamcinolone (KENALOG) 0.025 % cream Apply 1 application topically 2 (two) times daily.   Yes [provider]  cyclobenzaprine (FLEXERIL) 5 MG tablet Take 1 tablet (5 mg total) by mouth 2 (two) times daily as needed for up to 5 days for muscle spasms. 08/19/19 08/24/19  Hall-Potvin, [place], PA-C  desonide (DESOWEN) 0.05 % ointment Apply 1 application topically 2 (two) times daily.    [provider]  naproxen (NAPROSYN) 500 MG tablet Take 1 tablet (500 mg total) by mouth 2 (two) times daily. 08/19/19   Hall-Potvin, [place], PA-C  oxybutynin (DITROPAN) 5 MG tablet Take 5 mg by mouth 2 (two) times daily. 01/12/15   [provider]  triamcinolone cream (KENALOG) 0.1 % Apply 1 application topically 2 (two) times daily.    [provider]    Family History Family History  Problem Relation Age of Onset  . Sarcoidosis Mother   . Lung cancer Father   .  Diabetes Father   . Lung disease Mother 10       past away  . Diabetes Maternal Aunt   . Diabetes Maternal Grandmother   . Heart disease Paternal Grandfather     Social History Social History   Tobacco Use  . Smoking status: Never Smoker  . Smokeless tobacco: Never Used  Substance Use Topics  . Alcohol use: Yes    Comment: socially  . Drug use: Never     Allergies   Patient has no known allergies.   Review of Systems Review of Systems  Constitutional: Negative for fatigue and fever.  Respiratory: Negative for cough and shortness of breath.   Cardiovascular: Negative for chest pain and palpitations.  Musculoskeletal: Positive for back pain. Negative for gait problem, neck pain and neck stiffness.  Neurological: Negative for weakness and numbness.     Physical Exam Triage Vital Signs ED Triage Vitals  Enc Vitals Group     BP      Pulse      Resp      Temp      Temp src      SpO2       Weight      Height      Head Circumference      Peak Flow      Pain Score      Pain Loc      Pain Edu?      Excl. in [place]?    No data found.  Updated Vital Signs BP 139/87 (BP Location: Left Arm)   Pulse (!) 58   Temp 98.5 F (36.9 C) (Oral)   Resp 18   SpO2 98%   Visual Acuity Right Eye Distance:   Left Eye Distance:   Bilateral Distance:    Right Eye Near:   Left Eye Near:    Bilateral Near:     Physical Exam Constitutional:      General: She is not in acute distress. HENT:     Head: Normocephalic and atraumatic.  Eyes:     General: No scleral icterus.    Pupils: Pupils are equal, round, and reactive to light.  Cardiovascular:     Rate and Rhythm: Normal rate and regular rhythm.  Pulmonary:     Effort: Pulmonary effort is normal. No respiratory distress.     Breath sounds: No wheezing.  Musculoskeletal:     Comments: Low back without obvious deformity, scoliosis, edema.  Patient diffusely tender through latissimus dorsi bilaterally.  No edema, fluctuance, spasm appreciated  Skin:    Capillary Refill: Capillary refill takes less than 2 seconds.     Coloration: Skin is not jaundiced or pale.  Neurological:     General: No focal deficit present.     Mental Status: She is alert and oriented to person, place, and time.     Sensory: No sensory deficit.     Motor: No weakness.     Gait: Gait normal.     Deep Tendon Reflexes: Reflexes normal.      UC Treatments / Results  Labs (all labs ordered are listed, but only abnormal results are displayed) Labs Reviewed - No data to display  EKG   Radiology No results found.  Procedures Procedures (including critical care time)  Medications Ordered in UC Medications - No data to display  Initial Impression / Assessment and Plan / UC Course  I have reviewed the triage vital signs and the nursing notes.  Pertinent labs &  imaging results that were available during my care of the patient were reviewed by me and  considered in my medical decision making (see chart for details).     H&P consistent with musculoskeletal strain: We will treat supportively as outlined below.  Patient to refrain from weightlifting for 1 week.  Return precautions discussed, patient verbalized understanding and is agreeable to plan. Final Clinical Impressions(s) / UC Diagnoses   Final diagnoses:  Strain of latissimus dorsi muscle, initial encounter     Discharge Instructions     Recommend RICE: rest, ice, compression, elevation as needed for pain.    Heat therapy (hot compress, warm wash red, hot showers, etc.) can help relax muscles and soothe muscle aches. Cold therapy (ice packs) can be used to help swelling both after injury and after prolonged use of areas of chronic pain/aches.  May take muscle relaxer as needed for severe pain / spasm.  (This medication may cause you to become tired so it is important you do not drink alcohol or operate heavy machinery while on this medication.  Recommend your first dose to be taken before bedtime to monitor for side effects safely)  Go to ER return for worsening pain, saddle area numbness, inability to hold bladder/bowels, fever.    ED Prescriptions    Medication Sig Dispense Auth. Provider   naproxen (NAPROSYN) 500 MG tablet Take 1 tablet (500 mg total) by mouth 2 (two) times daily. 30 tablet Hall-Potvin, [place], PA-C   cyclobenzaprine (FLEXERIL) 5 MG tablet Take 1 tablet (5 mg total) by mouth 2 (two) times daily as needed for up to 5 days for muscle spasms. 10 tablet Hall-Potvin, [place], PA-C     PDMP not reviewed this encounter.   Neldon Mc [place], [place] 08/19/19 1933

## 2019-08-19 NOTE — ED Triage Notes (Signed)
Pt presents to [facility] for assessment of mid-lower back pain starting Wednesday after work.  Pt describes it as sore and stiff.  Patient states after she's been sitting or laying it is very stiff and makes it hard to move.

## 2019-08-19 NOTE — Discharge Instructions (Signed)
Recommend RICE: rest, ice, compression, elevation as needed for pain.    Heat therapy (hot compress, warm wash red, hot showers, etc.) can help relax muscles and soothe muscle aches. Cold therapy (ice packs) can be used to help swelling both after injury and after prolonged use of areas of chronic pain/aches.  May take muscle relaxer as needed for severe pain / spasm.  (This medication may cause you to become tired so it is important you do not drink alcohol or operate heavy machinery while on this medication.  Recommend your first dose to be taken before bedtime to monitor for side effects safely)  Go to ER return for worsening pain, saddle area numbness, inability to hold bladder/bowels, fever.

## 2020-05-21 ENCOUNTER — Telehealth: Payer: Self-pay | Admitting: Oncology

## 2020-05-21 NOTE — Telephone Encounter (Signed)
Received a new pt referral from [place] for Ms. Delima to re-establish care for anemia. She has been scheduled to see Dr, Alen Blew on 9/30 at 11am. I was unable to reach the pt. Letter mailed.

## 2020-05-24 ENCOUNTER — Other Ambulatory Visit: Payer: Self-pay | Admitting: Internal Medicine

## 2020-05-24 DIAGNOSIS — E010 Iodine-deficiency related diffuse (endemic) goiter: Secondary | ICD-10-CM

## 2020-06-21 ENCOUNTER — Encounter: Payer: 59 | Admitting: Oncology

## 2021-09-09 ENCOUNTER — Encounter: Payer: Self-pay | Admitting: Gastroenterology

## 2021-10-09 ENCOUNTER — Ambulatory Visit (INDEPENDENT_AMBULATORY_CARE_PROVIDER_SITE_OTHER): Payer: 59 | Admitting: Gastroenterology

## 2021-10-09 ENCOUNTER — Encounter: Payer: Self-pay | Admitting: Gastroenterology

## 2021-10-09 VITALS — BP 118/70 | HR 72 | Ht 65.0 in | Wt 288.4 lb

## 2021-10-09 DIAGNOSIS — Z862 Personal history of diseases of the blood and blood-forming organs and certain disorders involving the immune mechanism: Secondary | ICD-10-CM | POA: Diagnosis not present

## 2021-10-09 DIAGNOSIS — R195 Other fecal abnormalities: Secondary | ICD-10-CM

## 2021-10-09 NOTE — Patient Instructions (Signed)
If you are age 55 or older, your body mass index should be between 23-30. Your Body mass index is 47.99 kg/m. If this is out of the aforementioned range listed, please consider follow up with your Primary Care Provider.  If you are age 38 or younger, your body mass index should be between 19-25. Your Body mass index is 47.99 kg/m. If this is out of the aformentioned range listed, please consider follow up with your Primary Care Provider.   We will get records from Dr.Mann's office and Dr.Phorr's office.  The [facility] providers would like to encourage you to use [facility] to communicate with providers for non-urgent requests or questions.  Due to long hold times on the telephone, sending your provider a message by [facility] may be a faster and more efficient way to get a response.  Please allow 48 business hours for a response.  Please remember that this is for non-urgent requests.   It was a pleasure to see you today!  Thank you for trusting me with your gastrointestinal care!    Scott E.Candis Schatz, MD

## 2021-10-09 NOTE — Progress Notes (Signed)
HPI : Wendy Huffman is a pleasant 56 year old female with a history of morbid obesity and iron deficiency anemia who is referred to [place] by Dr. Deland Pretty for a positive fecal occult blood test.  Patient states test was performed around September or October 2022.  She denies any GI symptoms to include hematochezia/melena, constipation, diarrhea or abdominal pain.  She reports regular bowel movements with formed soft stool, no straining.  She states she had a colonoscopy a few years ago which was normal, and she was recommended repeat colonoscopy in 10 years.  She has no family history of colon cancer. Is unclear why the fecal occult blood test was recommended. She states that she has a history of iron deficiency anemia that was presumed to be secondary to her heavy menses.  She had been taking oral iron for a long time but was told to stop by her gynecologist in 2021 when she had stopped menstruating.  The last CBC I have available for review was from 2014 which showed a hemoglobin of 9.4 with an MCV in the 60s. Review of electronic medical record indicates that she was seen by Dr. Collene Mares in 2015 for a positive fecal occult blood test.  It appears she had a colonoscopy in January 2016, but I do not have that report for review, but again the patient states that it was normal and she was recommended to repeat in 10 years.   Past Medical History:  Diagnosis Date   Abnormal Pap smear    Anemia    Arthritis    knee   Eczema    Fibroid    history    H/O: pituitary tumor 2005   Scoliosis    DX as a child back pain   STD (female)    H/O of std's      Past Surgical History:  Procedure Laterality Date   BREAST SURGERY     CESAREAN SECTION     CESAREAN SECTION  x2   CESAREAN SECTION     KNEE ARTHROPLASTY Right    KNEE SURGERY  2000   left  knee    LAPAROSCOPIC TUBAL LIGATION  02/20/2012   Procedure: LAPAROSCOPIC TUBAL LIGATION;  Surgeon: Eldred Manges, MD;  Location: [facility];  Service:  Gynecology;  Laterality: Bilateral;   WISDOM TOOTH EXTRACTION  87's   Family History  Problem Relation Age of Onset   Sarcoidosis Mother    Lung disease Mother 28       past away   Lung cancer Father    Diabetes Father    Diabetes Maternal Grandmother    Heart disease Paternal Grandfather    Diabetes Maternal Aunt    Stomach cancer Neg Hx    Colon cancer Neg Hx    Esophageal cancer Neg Hx    Pancreatic cancer Neg Hx    Social History   Tobacco Use   Smoking status: Never   Smokeless tobacco: Never  Vaping Use   Vaping Use: Never used  Substance Use Topics   Alcohol use: Yes    Comment: socially   Drug use: Never   Current Outpatient Medications  Medication Sig Dispense Refill   oxybutynin (DITROPAN) 5 MG tablet Take 5 mg by mouth 2 (two) times daily.     triamcinolone cream (KENALOG) 0.1 % Apply 1 application topically 2 (two) times daily.     Multiple Vitamins-Minerals (MULTIVITAMIN ADULTS PO) multivitamin  1 tablet po daily     No current facility-administered medications  for this visit.   No Known Allergies   Review of Systems: All systems reviewed and negative except where noted in HPI.    No results found.  Physical Exam: Pulse 72    Ht 5\' 5"  (1.651 m)    Wt 288 lb 6.4 oz (130.8 kg)    SpO2 99%    BMI 47.99 kg/m  Constitutional: Pleasant,well-developed, obese African-American female in no acute distress. HEENT: Normocephalic and atraumatic. Conjunctivae are normal. No scleral icterus. Cardiovascular: Normal rate, regular rhythm.  Pulmonary/chest: Effort normal and breath sounds normal. No wheezing, rales or rhonchi. Abdominal: Soft, nondistended, nontender. Bowel sounds active throughout. There are no masses palpable. No hepatomegaly. Extremities: no edema Neurological: Alert and oriented to person place and time. Skin: Skin is warm and dry. No rashes noted. Psychiatric: Normal mood and affect. Behavior is normal.  CBC    Component Value Date/Time    WBC 11.4 (H) 10/16/2012 1839   RBC 4.57 10/16/2012 1839   HGB 9.4 (L) 10/16/2012 1839   HCT 31.2 (L) 10/16/2012 1839   PLT 318 10/16/2012 1839   MCV 68.3 (L) 10/16/2012 1839   MCH 20.6 (L) 10/16/2012 1839   MCHC 30.1 10/16/2012 1839   RDW 17.4 (H) 10/16/2012 1839   LYMPHSABS 2.4 10/16/2012 1839   MONOABS 0.8 10/16/2012 1839   EOSABS 0.1 10/16/2012 1839   BASOSABS 0.0 10/16/2012 1839    CMP     Component Value Date/Time   NA 136 10/16/2012 1839   K 3.2 (L) 10/16/2012 1839   CL 102 10/16/2012 1839   CO2 23 10/16/2012 1839   GLUCOSE 164 (H) 10/16/2012 1839   BUN 11 10/16/2012 1839   CREATININE 0.70 10/16/2012 1839   CALCIUM 8.9 10/16/2012 1839   PROT 7.2 10/16/2012 1839   ALBUMIN 3.1 (L) 10/16/2012 1839   AST 16 10/16/2012 1839   ALT 15 10/16/2012 1839   ALKPHOS 145 (H) 10/16/2012 1839   BILITOT 0.3 10/16/2012 1839   GFRNONAA >90 10/16/2012 1839   GFRAA >90 10/16/2012 1839     ASSESSMENT AND PLAN: 56 year old female with history of iron deficiency anemia, presumed secondary to heavy menses, referred for positive fecal occult blood test.  It appears that she had a fecal occult blood test positive in 2015 and underwent a colonoscopy in 2016 which was reportedly normal and she was recommended repeat in 10 years.  I have none of the objective data to confirm this.  We will request records from Dr. Lambert Mody office and Dr. Lorie Apley office to confirm the history of previously false positive FOBT and normal colonoscopy.  If the patient has had a false positive fecal occult blood test with a normal colonoscopy with good bowel prep in 2016, I think it may be reasonable not to repeat a colonoscopy at this time based on another positive FOBT.  I would also like to have patient's recent CBC and iron panel if available.  As the patient stopped menstruating 2 years ago, if she is persistently iron deficient, then I think an endoscopic evaluation (both upper and lower) would be  indicated.  Positive FOBT - Obtain colonoscopy report from January 2016 from Dr. Lorie Apley office - Obtain labs from Dr. Lambert Mody office (CBC, iron panel, previous fecal occult blood test from 2015)  History of iron deficiency anemia, presumed secondary to menstrual blood loss -Recent labs from Dr. Lambert Mody office as above - If still anemic, recommend EGD and colonoscopy given no menses in 2 years  Natiya Seelinger E. Candis Schatz, MD  [facility]  CC:  Deland Pretty, MD

## 2021-10-10 ENCOUNTER — Encounter: Payer: Self-pay | Admitting: Gastroenterology
